# Patient Record
Sex: Female | Born: 1960 | Race: White | Hispanic: No | Marital: Married | Smoking: Former smoker
Health system: Southern US, Community
[De-identification: ages and names within clinical notes are randomized; demographics above are authoritative.]

## PROBLEM LIST (undated history)

## (undated) DIAGNOSIS — M5430 Sciatica, unspecified side: Secondary | ICD-10-CM

## (undated) DIAGNOSIS — M1990 Unspecified osteoarthritis, unspecified site: Secondary | ICD-10-CM

## (undated) DIAGNOSIS — J45909 Unspecified asthma, uncomplicated: Secondary | ICD-10-CM

## (undated) DIAGNOSIS — F32A Depression, unspecified: Secondary | ICD-10-CM

## (undated) DIAGNOSIS — F419 Anxiety disorder, unspecified: Secondary | ICD-10-CM

## (undated) DIAGNOSIS — J449 Chronic obstructive pulmonary disease, unspecified: Secondary | ICD-10-CM

## (undated) HISTORY — PX: APPENDECTOMY: SHX54

## (undated) HISTORY — DX: Depression, unspecified: F32.A

## (undated) HISTORY — DX: Anxiety disorder, unspecified: F41.9

## (undated) HISTORY — PX: TONSILLECTOMY: SUR1361

## (undated) HISTORY — DX: Unspecified osteoarthritis, unspecified site: M19.90

## (undated) HISTORY — PX: CHOLECYSTECTOMY: SHX55

## (undated) HISTORY — DX: Chronic obstructive pulmonary disease, unspecified: J44.9

---

## 1998-06-20 ENCOUNTER — Emergency Department (HOSPITAL_COMMUNITY): Admission: EM | Admit: 1998-06-20 | Discharge: 1998-06-20 | Payer: Self-pay

## 1998-06-24 ENCOUNTER — Emergency Department (HOSPITAL_COMMUNITY): Admission: EM | Admit: 1998-06-24 | Discharge: 1998-06-24 | Payer: Self-pay | Admitting: Emergency Medicine

## 1998-08-07 ENCOUNTER — Emergency Department (HOSPITAL_COMMUNITY): Admission: EM | Admit: 1998-08-07 | Discharge: 1998-08-08 | Payer: Self-pay | Admitting: Emergency Medicine

## 1999-02-23 ENCOUNTER — Inpatient Hospital Stay (HOSPITAL_COMMUNITY): Admission: EM | Admit: 1999-02-23 | Discharge: 1999-02-25 | Payer: Self-pay

## 1999-02-24 ENCOUNTER — Encounter: Payer: Self-pay | Admitting: Pediatrics

## 1999-10-22 ENCOUNTER — Emergency Department (HOSPITAL_COMMUNITY): Admission: EM | Admit: 1999-10-22 | Discharge: 1999-10-22 | Payer: Self-pay | Admitting: Emergency Medicine

## 2010-05-18 ENCOUNTER — Emergency Department (HOSPITAL_COMMUNITY): Admission: EM | Admit: 2010-05-18 | Discharge: 2010-05-18 | Payer: Self-pay | Admitting: Emergency Medicine

## 2010-05-18 IMAGING — CR DG FINGER RING 2+V*L*
3 series · 3 of 3 positions shown · non-contrast
Comparison: None.

CLINICAL DATA: Injury to left fourth finger

LEFT RING FINGER 2+V

[x finger pa left]
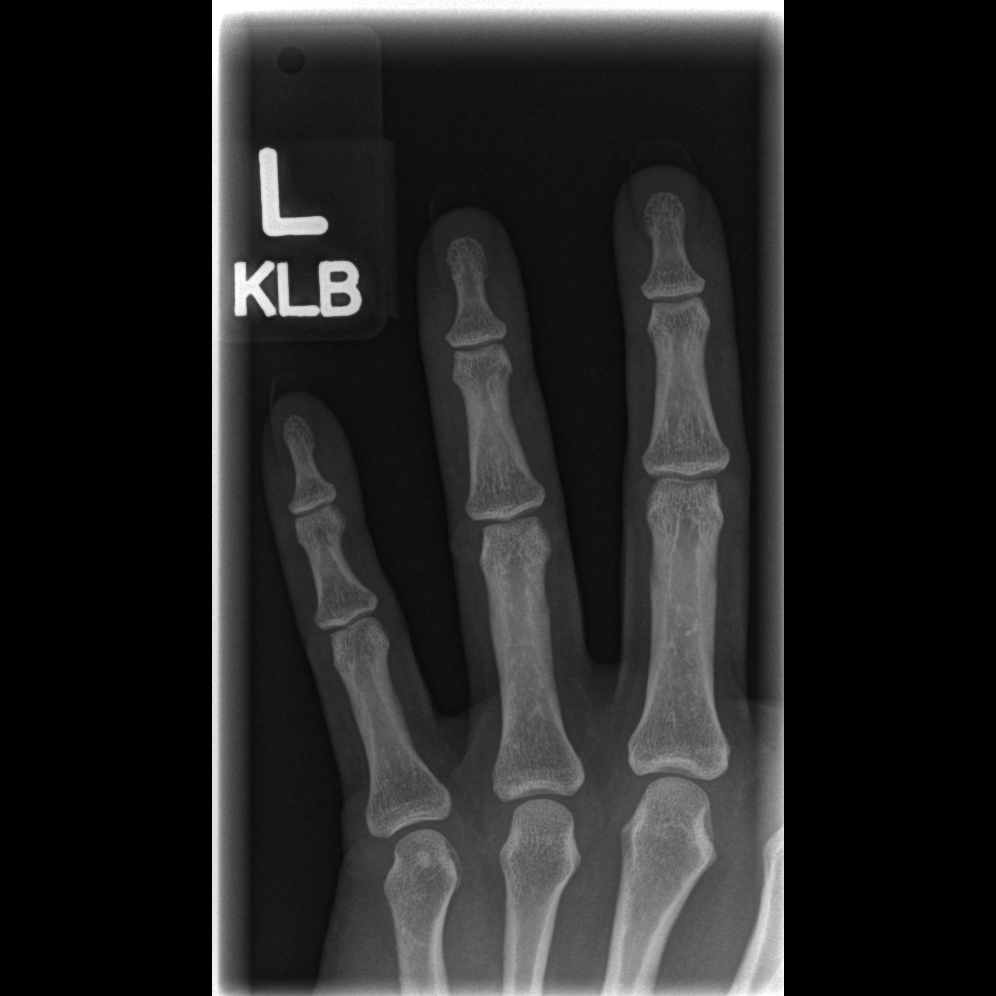

[x finger obl. left]
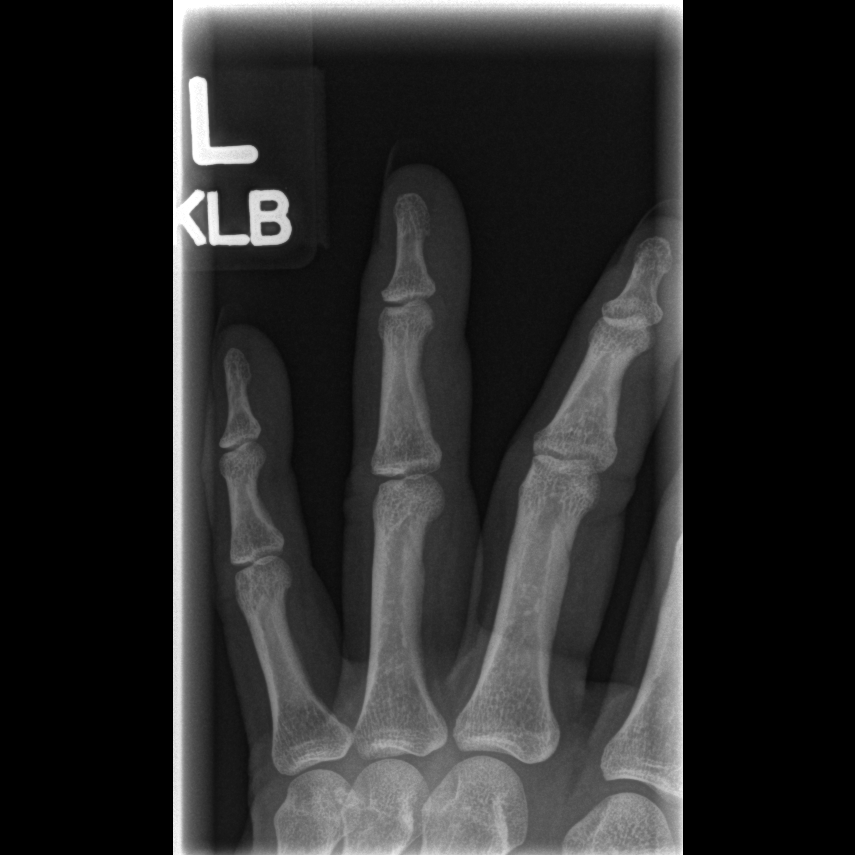

[x finger lateral left]
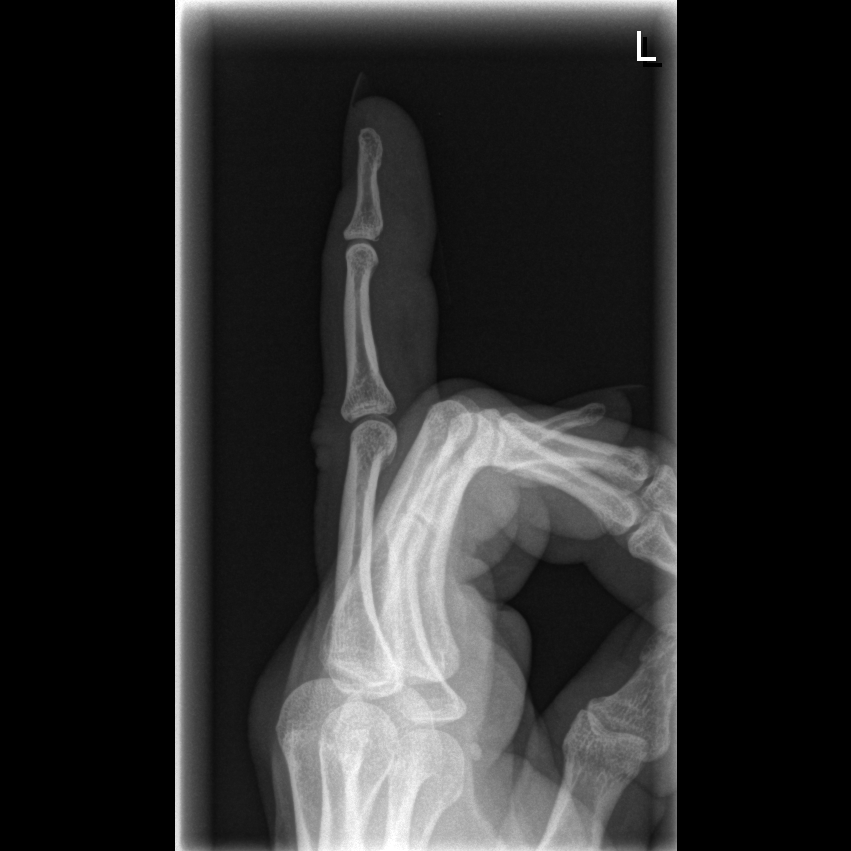

[3 of 3 positions shown; findings below may reference images not displayed]

FINDINGS: No fracture or subluxation.  .
IMPRESSION: Negative left fourth finger

## 2014-07-25 ENCOUNTER — Telehealth: Payer: Self-pay | Admitting: Endocrinology

## 2014-09-02 NOTE — Telephone Encounter (Signed)
Encounter Created in error

## 2020-05-19 ENCOUNTER — Encounter (HOSPITAL_COMMUNITY): Payer: Self-pay | Admitting: Emergency Medicine

## 2020-05-19 ENCOUNTER — Emergency Department (HOSPITAL_COMMUNITY): Payer: Self-pay

## 2020-05-19 ENCOUNTER — Emergency Department (HOSPITAL_COMMUNITY)
Admission: EM | Admit: 2020-05-19 | Discharge: 2020-05-19 | Disposition: A | Discharge status: Home / Self Care | Payer: Self-pay | Attending: Emergency Medicine | Admitting: Emergency Medicine

## 2020-05-19 ENCOUNTER — Other Ambulatory Visit: Payer: Self-pay

## 2020-05-19 DIAGNOSIS — M5431 Sciatica, right side: Secondary | ICD-10-CM | POA: Insufficient documentation

## 2020-05-19 DIAGNOSIS — F17210 Nicotine dependence, cigarettes, uncomplicated: Secondary | ICD-10-CM | POA: Insufficient documentation

## 2020-05-19 HISTORY — DX: Sciatica, unspecified side: M54.30

## 2020-05-19 IMAGING — DX DG LUMBAR SPINE COMPLETE 4+V
5 series · 5 of 5 positions shown · non-contrast
Comparison: None.

CLINICAL DATA: Pt here via EMS with pain to RIGHT hip going down
her leg. Hx of sciatica are

EXAM:
LUMBAR SPINE - COMPLETE 4+ VIEW; DG HIP (WITH OR WITHOUT PELVIS)
2-3V RIGHT

[l-spine ap]
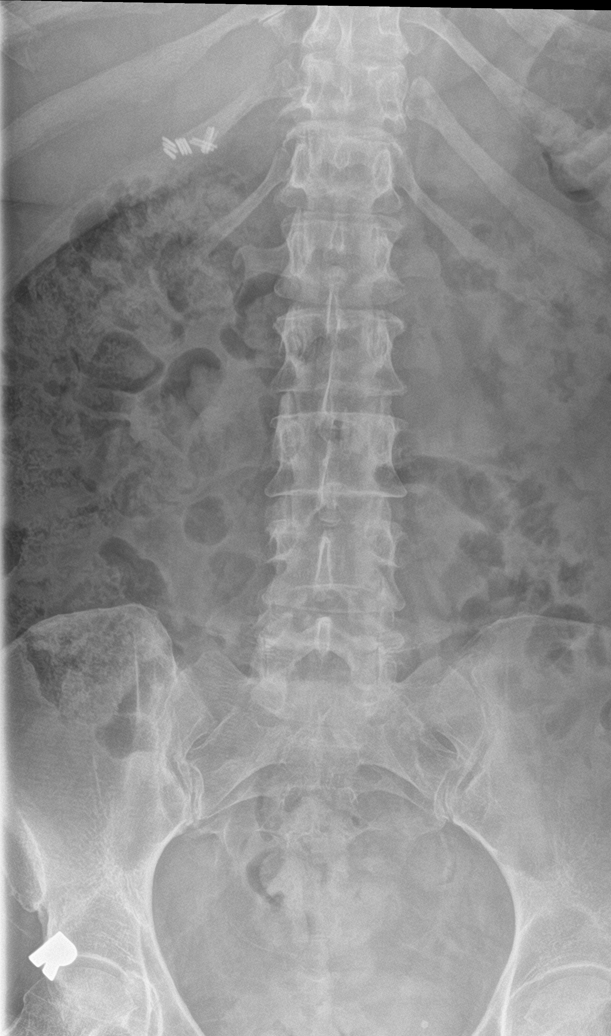

[l-spine obl (1 of 2)]
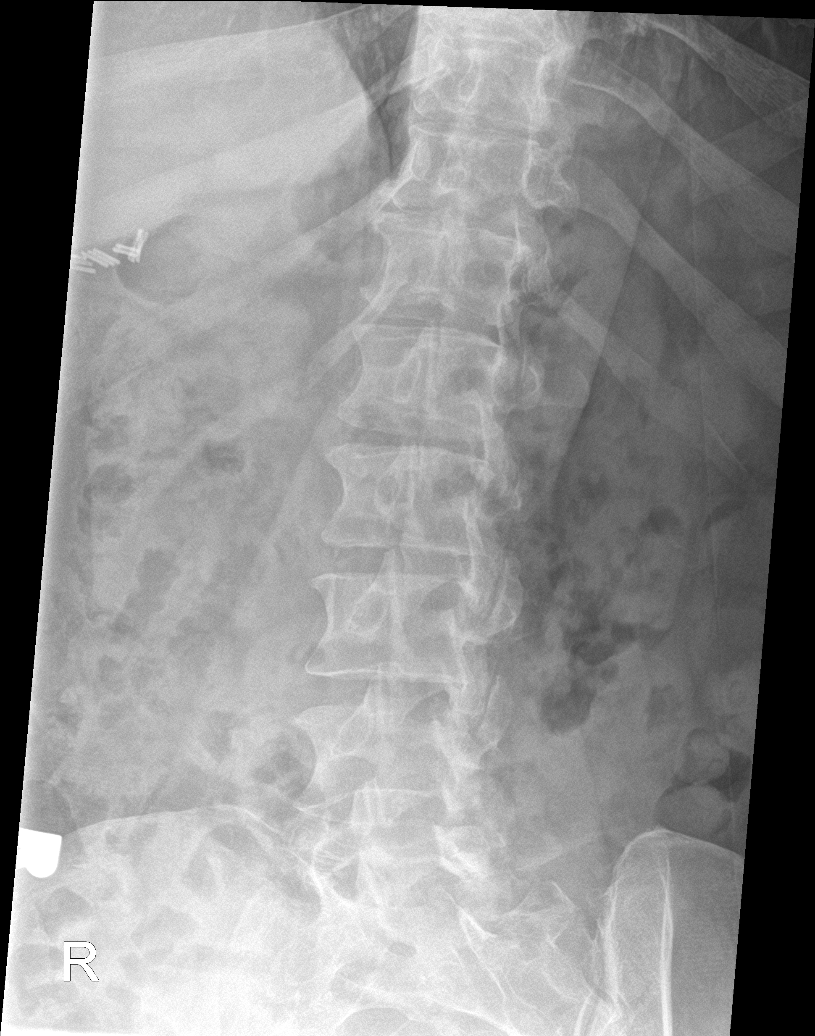

[l-spine obl (2 of 2)]
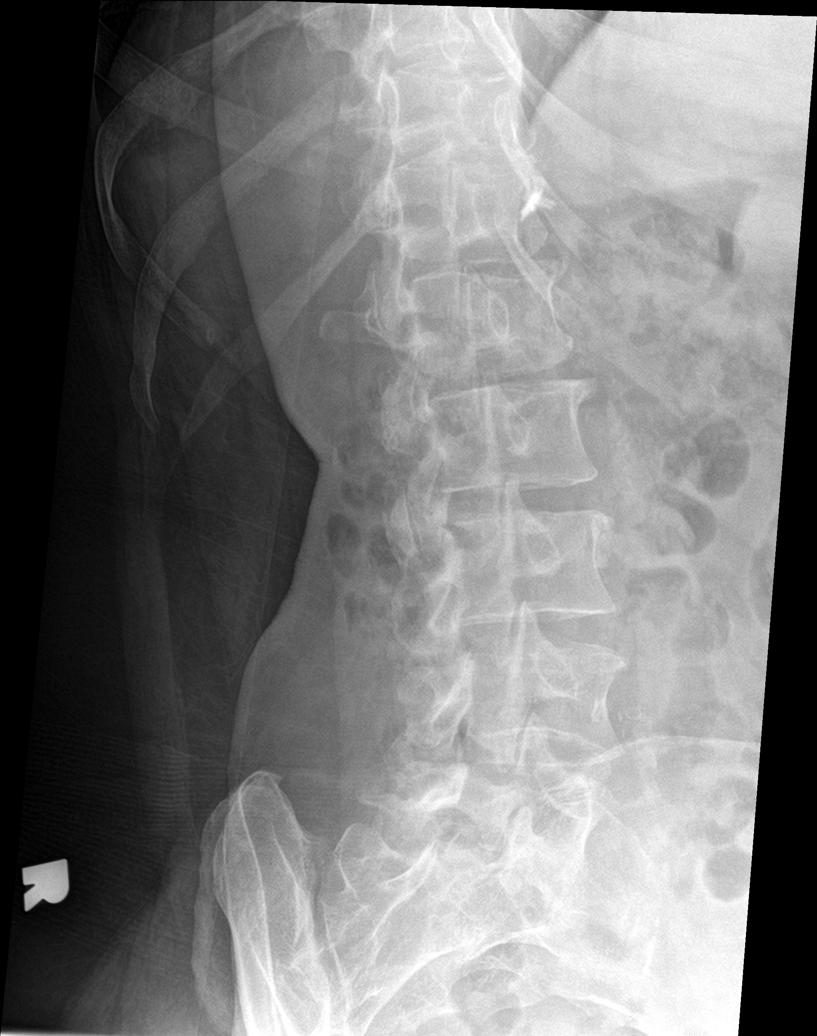

[l-spine lat]
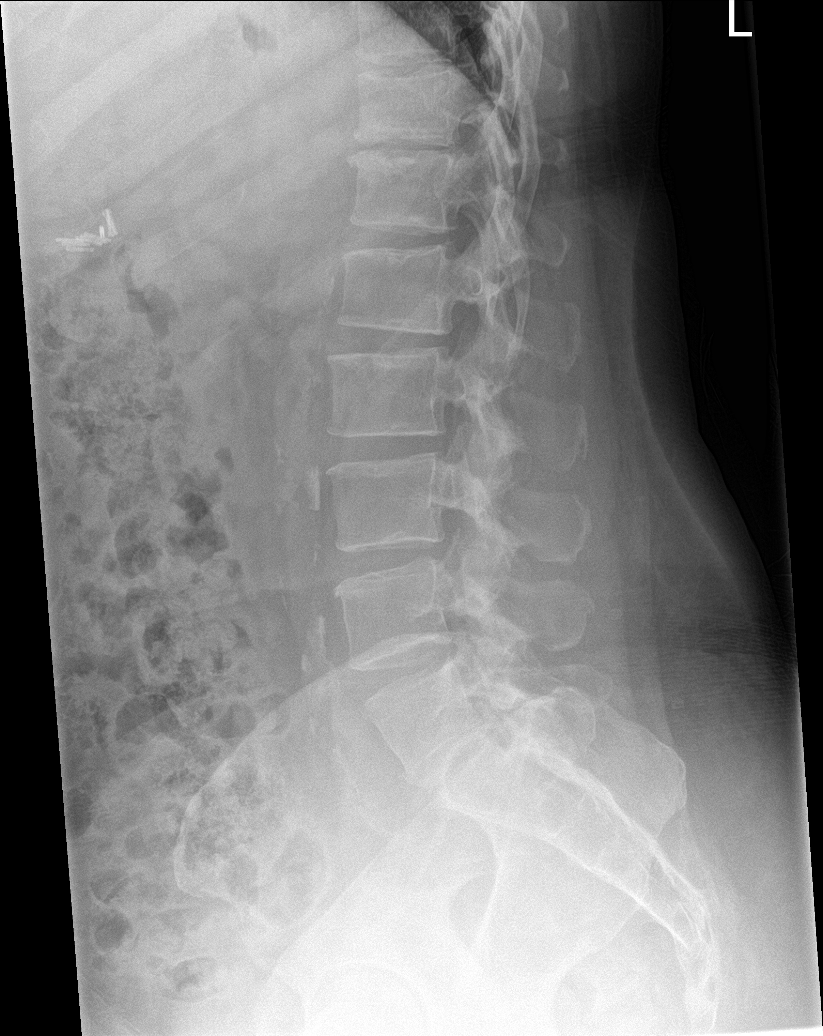

[l-spine spot]
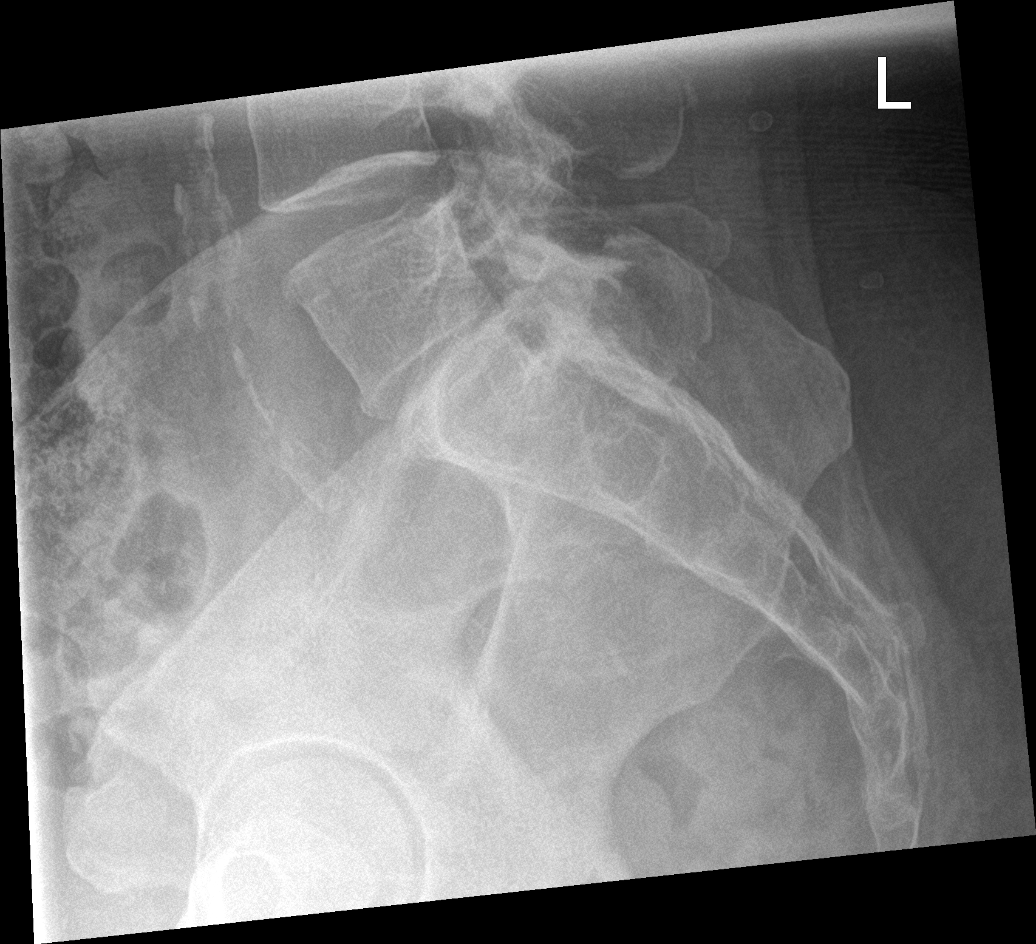

[5 of 5 positions shown; findings below may reference images not displayed]

FINDINGS: Lumbar spine: 5 non-rib-bearing lumbar type vertebral bodies. There
is minimal anterior wedging of the L1 vertebral body which could be
on a degenerative basis. The remaining vertebral body heights are
maintained. Intervertebral disc spaces are maintained. Mild
multilevel anterior spurring. Normal alignment. No focal bone
lesion. Surgical clips are noted in the right upper quadrant. SI
joints are symmetric. Aortic vascular calcification.

Right hip: No evidence of fracture or dislocation. Right hip joint
space is maintained. Pelvic ring is intact. Regional soft tissues
are unremarkable.
IMPRESSION: 1. Mild anterior wedging of the L1 vertebral body, age
indeterminate, could be on a degenerative basis. Recommend
correlation for focal pain and if there is clinical concern for
injury consider cross-sectional imaging.
2. No acute osseous abnormality in the right hip.

## 2020-05-19 IMAGING — DX DG HIP (WITH OR WITHOUT PELVIS) 2-3V*R*
3 series · 3 of 3 positions shown · non-contrast
Comparison: None.

CLINICAL DATA: Pt here via EMS with pain to RIGHT hip going down
her leg. Hx of sciatica are

EXAM:
LUMBAR SPINE - COMPLETE 4+ VIEW; DG HIP (WITH OR WITHOUT PELVIS)
2-3V RIGHT

[pelvis ap]
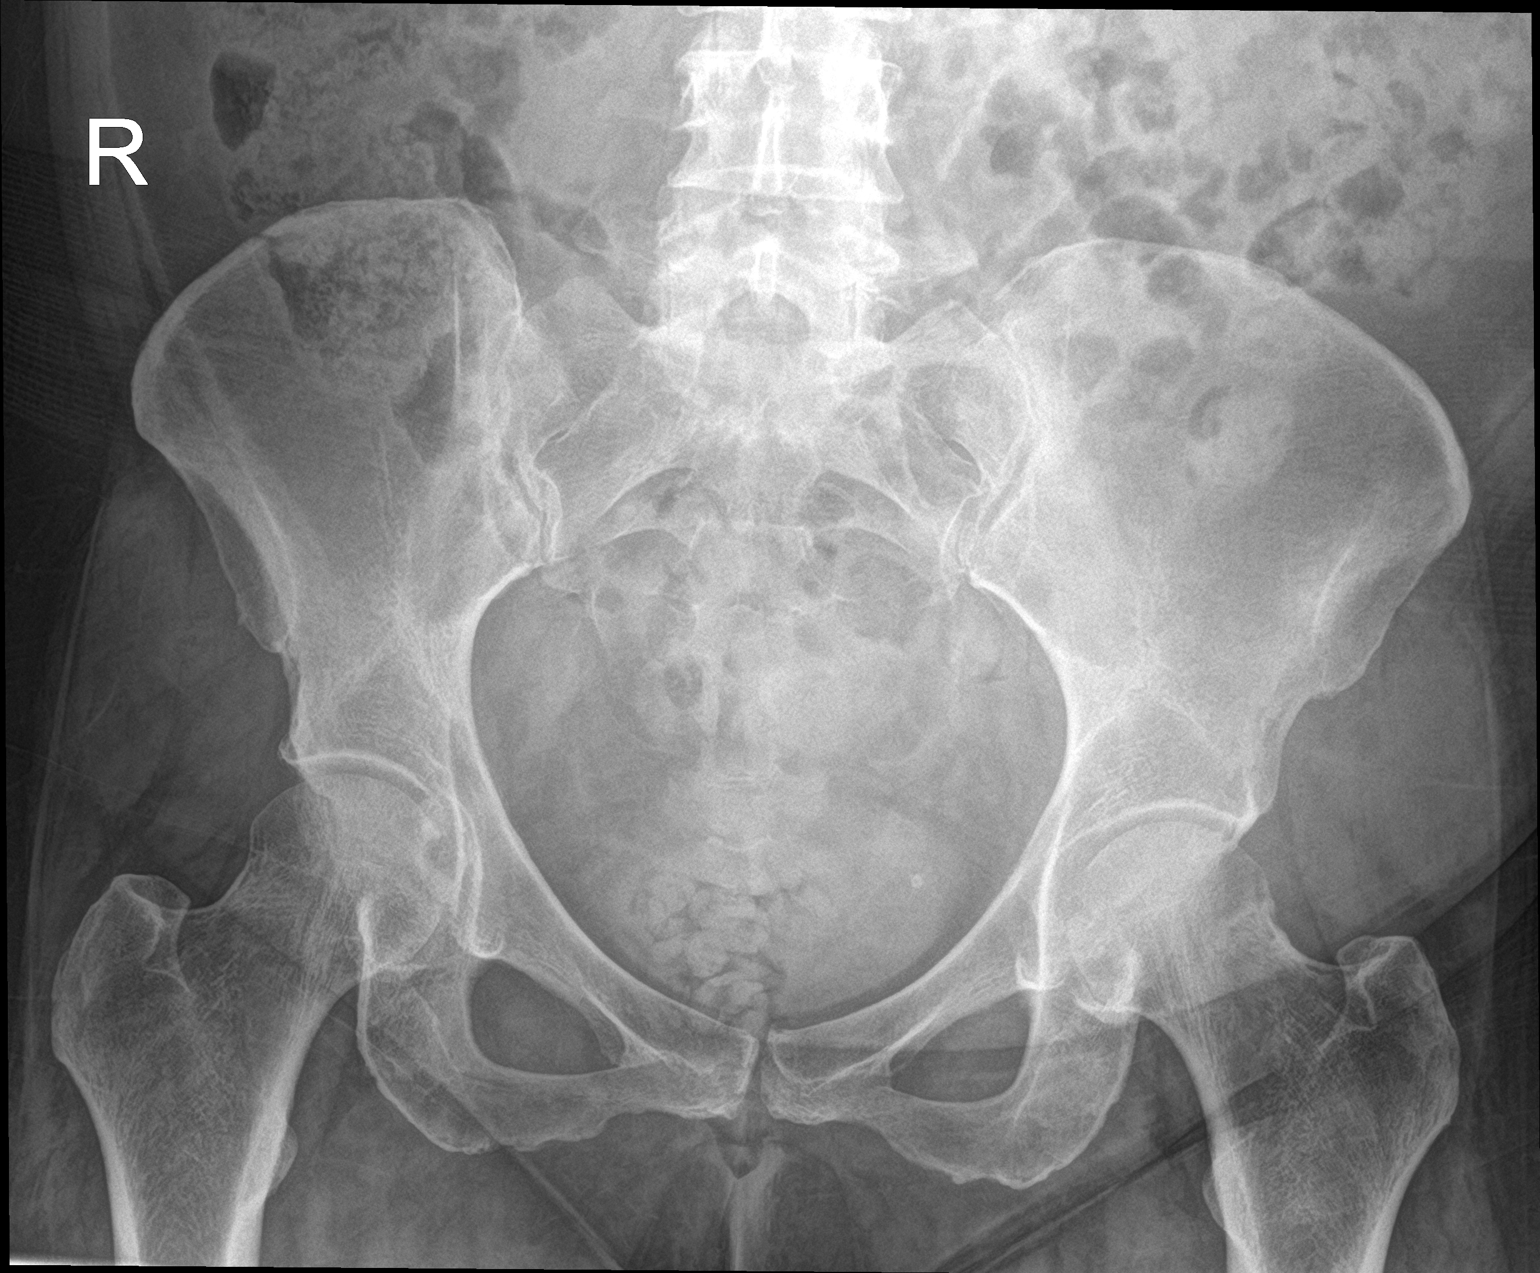

[hip ap]
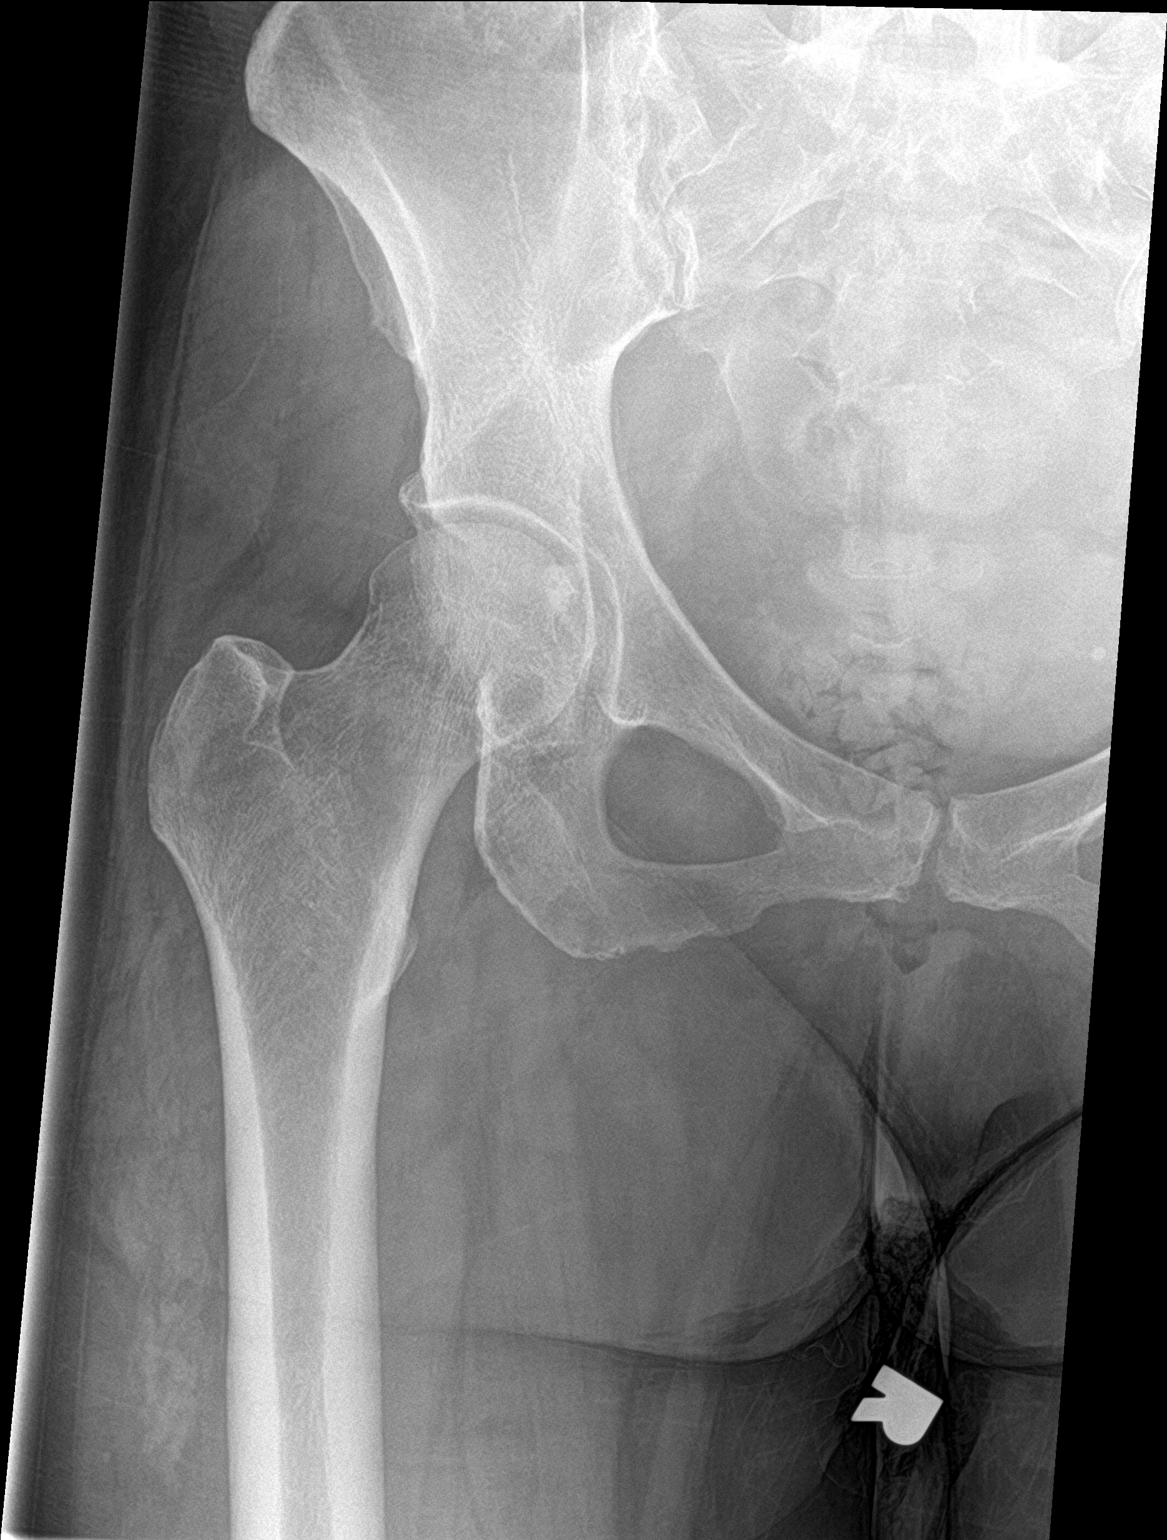

[hip frog leg]
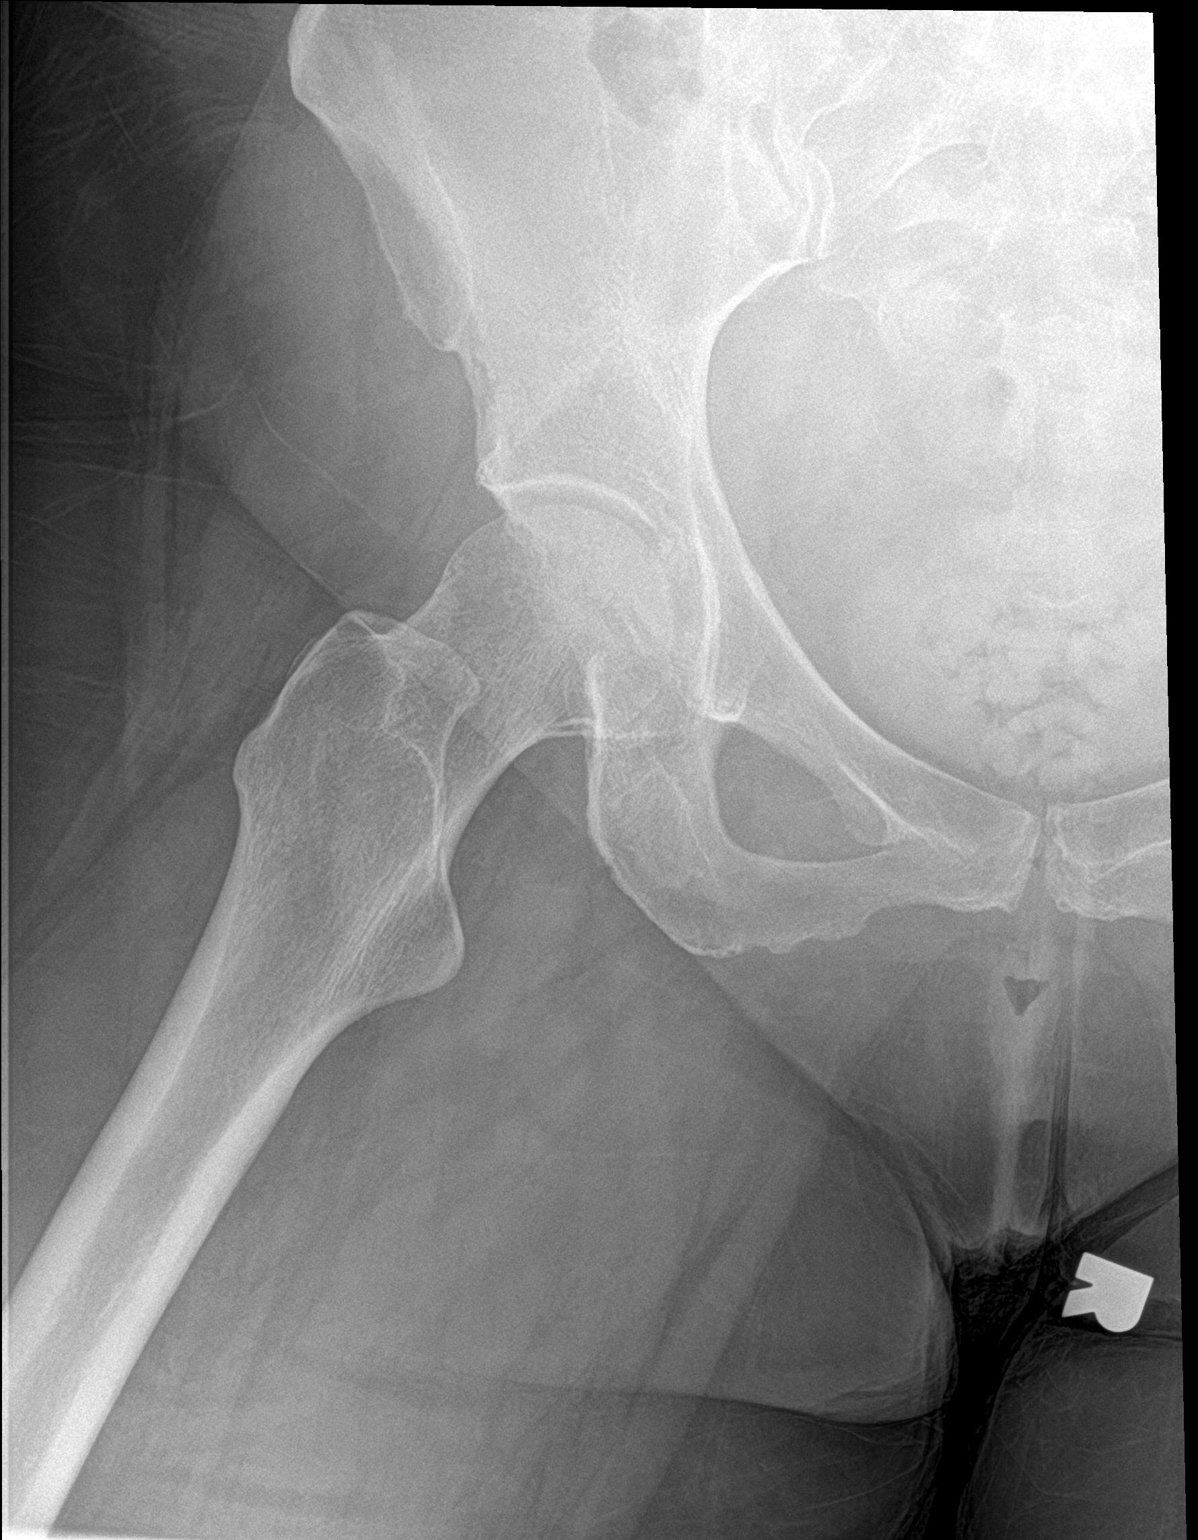

[3 of 3 positions shown; findings below may reference images not displayed]

FINDINGS: Lumbar spine: 5 non-rib-bearing lumbar type vertebral bodies. There
is minimal anterior wedging of the L1 vertebral body which could be
on a degenerative basis. The remaining vertebral body heights are
maintained. Intervertebral disc spaces are maintained. Mild
multilevel anterior spurring. Normal alignment. No focal bone
lesion. Surgical clips are noted in the right upper quadrant. SI
joints are symmetric. Aortic vascular calcification.

Right hip: No evidence of fracture or dislocation. Right hip joint
space is maintained. Pelvic ring is intact. Regional soft tissues
are unremarkable.
IMPRESSION: 1. Mild anterior wedging of the L1 vertebral body, age
indeterminate, could be on a degenerative basis. Recommend
correlation for focal pain and if there is clinical concern for
injury consider cross-sectional imaging.
2. No acute osseous abnormality in the right hip.

## 2020-05-19 MED ORDER — MELOXICAM 7.5 MG PO TABS
7.5000 mg | ORAL_TABLET | Freq: Every day | ORAL | 0 refills | Status: AC
Start: 2020-05-19 — End: 2020-05-29

## 2020-05-19 MED ORDER — METHOCARBAMOL 500 MG PO TABS
500.0000 mg | ORAL_TABLET | Freq: Once | ORAL | Status: AC
  Administered 2020-05-19: 500 mg via ORAL
  Filled 2020-05-19: qty 1

## 2020-05-19 MED ORDER — LIDOCAINE 5 % EX PTCH
1.0000 | MEDICATED_PATCH | CUTANEOUS | Status: DC
  Filled 2020-05-19: qty 1

## 2020-05-19 MED ORDER — HYDROCODONE-ACETAMINOPHEN 5-325 MG PO TABS
1.0000 | ORAL_TABLET | Freq: Once | ORAL | Status: AC
  Administered 2020-05-19: 1 via ORAL
  Filled 2020-05-19: qty 1

## 2020-05-19 MED ORDER — KETOROLAC TROMETHAMINE 60 MG/2ML IM SOLN
30.0000 mg | Freq: Once | INTRAMUSCULAR | Status: AC
  Administered 2020-05-19: 30 mg via INTRAMUSCULAR
  Filled 2020-05-19: qty 2

## 2020-05-19 MED ORDER — METHOCARBAMOL 500 MG PO TABS
500.0000 mg | ORAL_TABLET | Freq: Two times a day (BID) | ORAL | 0 refills | Status: DC
Start: 2020-05-19 — End: 2021-09-09

## 2020-05-19 NOTE — ED Provider Notes (Signed)
[facility] EMERGENCY DEPARTMENT Provider Note   CSN: [id number] Arrival date & time: 05/19/20  1923     History Chief Complaint  Patient presents with  . Sciatica    Sierra Deleon is a 59 y.o. female.  58yo female brought in by EMS from home with complaint of pain in her right buttock that radiates to lateral right thigh.  Pain to lateral right thigh is described as a numbness, reports her foot feels cold to her.  Patient states pain is off and on today, she went on to rest when she went to get up she was unable to stand up secondary to the pain.  Patient states she did have a fall a few days ago while making her bed and tripped on the frame, did not think she had significant injuries at that time.  Denies abdominal pain, groin numbness, loss of bowel or bladder control.  Patient has been taking naproxen at home without relief.        Past Medical History:  Diagnosis Date  . Sciatica     There are no problems to display for this patient.   Past Surgical History:  Procedure Laterality Date  . APPENDECTOMY    . CHOLECYSTECTOMY    . TONSILLECTOMY       OB History   No obstetric history on file.     History reviewed. No pertinent family history.  Social History   Tobacco Use  . Smoking status: Current Every Day Smoker  Substance Use Topics  . Alcohol use: Not Currently  . Drug use: Never    Home Medications Prior to Admission medications   Medication Sig Start Date End Date Taking? Authorizing Provider  meloxicam (MOBIC) 7.5 MG tablet Take 1 tablet (7.5 mg total) by mouth daily for 10 days. 05/19/20 05/29/20  Jeannie Fend, PA-C  methocarbamol (ROBAXIN) 500 MG tablet Take 1 tablet (500 mg total) by mouth 2 (two) times daily. 05/19/20   Jeannie Fend, PA-C    Allergies    Patient has no known allergies.  Review of Systems   Review of Systems  Constitutional: Negative for fever.  Gastrointestinal: Negative for abdominal pain, constipation and  diarrhea.  Genitourinary: Negative for difficulty urinating and dysuria.  Musculoskeletal: Positive for back pain and gait problem.  Skin: Negative for rash and wound.  Allergic/Immunologic: Negative for immunocompromised state.  Neurological: Positive for weakness and numbness.    Physical Exam Updated Vital Signs BP 110/70   Pulse 94   Temp (!) 97.1 F (36.2 C) (Temporal)   Resp 18   Ht 5\' 7"  (1.702 m)   Wt 90.7 kg   SpO2 99%   BMI 31.32 kg/m   Physical Exam Vitals and nursing note reviewed.  Constitutional:      General: She is not in acute distress.    Appearance: She is well-developed. She is not diaphoretic.  HENT:     Head: Normocephalic and atraumatic.  Cardiovascular:     Pulses: Normal pulses.  Pulmonary:     Effort: Pulmonary effort is normal.  Abdominal:     Palpations: Abdomen is soft.     Tenderness: There is no abdominal tenderness.  Musculoskeletal:        General: Tenderness present. No swelling or deformity.       Back:  Skin:    General: Skin is warm and dry.     Findings: No erythema or rash.  Neurological:     Mental Status: She is  alert and oriented to person, place, and time.     Sensory: Sensation is intact.     Motor: No weakness.     Deep Tendon Reflexes: Babinski sign absent on the right side. Babinski sign absent on the left side.     Reflex Scores:      Patellar reflexes are 1+ on the right side and 1+ on the left side.      Achilles reflexes are 1+ on the right side and 1+ on the left side. Psychiatric:        Behavior: Behavior normal.     ED Results / Procedures / Treatments   Labs (all labs ordered are listed, but only abnormal results are displayed) Labs Reviewed - No data to display  EKG None  Radiology DG Lumbar Spine Complete  Result Date: 05/19/2020 CLINICAL DATA:  Pt here via EMS with pain to RIGHT hip going down her leg. Hx of sciatica are EXAM: LUMBAR SPINE - COMPLETE 4+ VIEW; DG HIP (WITH OR WITHOUT PELVIS)  2-3V RIGHT COMPARISON:  None. FINDINGS: Lumbar spine: 5 non-rib-bearing lumbar type vertebral bodies. There is minimal anterior wedging of the L1 vertebral body which could be on a degenerative basis. The remaining vertebral body heights are maintained. Intervertebral disc spaces are maintained. Mild multilevel anterior spurring. Normal alignment. No focal bone lesion. Surgical clips are noted in the right upper quadrant. SI joints are symmetric. Aortic vascular calcification. Right hip: No evidence of fracture or dislocation. Right hip joint space is maintained. Pelvic ring is intact. Regional soft tissues are unremarkable. IMPRESSION: 1. Mild anterior wedging of the L1 vertebral body, age indeterminate, could be on a degenerative basis. Recommend correlation for focal pain and if there is clinical concern for injury consider cross-sectional imaging. 2. No acute osseous abnormality in the right hip. Electronically Signed   By: Audie Pinto M.D.   On: 05/19/2020 21:36   DG Hip Unilat With Pelvis 2-3 Views Right  Result Date: 05/19/2020 CLINICAL DATA:  Pt here via EMS with pain to RIGHT hip going down her leg. Hx of sciatica are EXAM: LUMBAR SPINE - COMPLETE 4+ VIEW; DG HIP (WITH OR WITHOUT PELVIS) 2-3V RIGHT COMPARISON:  None. FINDINGS: Lumbar spine: 5 non-rib-bearing lumbar type vertebral bodies. There is minimal anterior wedging of the L1 vertebral body which could be on a degenerative basis. The remaining vertebral body heights are maintained. Intervertebral disc spaces are maintained. Mild multilevel anterior spurring. Normal alignment. No focal bone lesion. Surgical clips are noted in the right upper quadrant. SI joints are symmetric. Aortic vascular calcification. Right hip: No evidence of fracture or dislocation. Right hip joint space is maintained. Pelvic ring is intact. Regional soft tissues are unremarkable. IMPRESSION: 1. Mild anterior wedging of the L1 vertebral body, age indeterminate, could be  on a degenerative basis. Recommend correlation for focal pain and if there is clinical concern for injury consider cross-sectional imaging. 2. No acute osseous abnormality in the right hip. Electronically Signed   By: Audie Pinto M.D.   On: 05/19/2020 21:36    Procedures Procedures (including critical care time)  Medications Ordered in ED Medications  lidocaine (LIDODERM) 5 % 1 patch (1 patch Transdermal Refused 05/19/20 2047)  methocarbamol (ROBAXIN) tablet 500 mg (500 mg Oral Given 05/19/20 2046)  HYDROcodone-acetaminophen (NORCO/VICODIN) 5-325 MG per tablet 1 tablet (1 tablet Oral Given 05/19/20 2046)  ketorolac (TORADOL) injection 30 mg (30 mg Intramuscular Given 05/19/20 2046)    ED Course  I have reviewed the  triage vital signs and the nursing notes.  Pertinent labs & imaging results that were available during my care of the patient were reviewed by me and considered in my medical decision making (see chart for details).  Clinical Course as of May 19 2148  Mon May 19, 2020  3545 59 year old female with complaint of pain in the right lower buttocks radiating to the right lateral thigh.  Patient did have a fall a few days ago, did not think she had any injuries at that time.  On exam patient has tenderness in her right SI joint, leg strength and reflexes are symmetric.  Patient was given Robaxin, Lidoderm, Norco, Toradol.  X-rays of the lumbar spine and pelvis completed.  Patient does have anterior wedging at L1, does not have any tenderness through the midline spine. Discussed results with patient, pain has improved with medications, states that she has a history of sciatica, previously treated with an injection and follow-up therapy with a chiropractor.  Patient recently relocated back to this area, does not have a PCP, will refer to [facility] for follow-up.  Will give prescriptions for meloxicam and Robaxin.  Recommend warm compresses and gentle stretching.   [LM]     Clinical Course User Index [LM] Alden Hipp   MDM Rules/Calculators/A&P                      Final Clinical Impression(s) / ED Diagnoses Final diagnoses:  Sciatica of right side    Rx / DC Orders ED Discharge Orders         Ordered    meloxicam (MOBIC) 7.5 MG tablet  Daily     05/19/20 2144    methocarbamol (ROBAXIN) 500 MG tablet  2 times daily     05/19/20 2144           Jeannie Fend, PA-C 05/19/20 2149    Maia Plan, MD 05/19/20 2318

## 2020-05-19 NOTE — Discharge Instructions (Addendum)
Take Robaxin and Meloxicam as needed as prescribed. Follow up with a primary care provider, contact [facility]. Recommend warm compresses followed by gentle stretching.

## 2020-05-19 NOTE — ED Triage Notes (Signed)
Pt here via EMS with pain to RIGHT hip going down her leg. Hx of sciatica.

## 2021-01-09 ENCOUNTER — Emergency Department (HOSPITAL_COMMUNITY)
Admission: EM | Admit: 2021-01-09 | Discharge: 2021-01-09 | Disposition: A | Discharge status: Home / Self Care | Payer: Self-pay | Attending: Emergency Medicine | Admitting: Emergency Medicine

## 2021-01-09 ENCOUNTER — Encounter (HOSPITAL_COMMUNITY): Payer: Self-pay | Admitting: Emergency Medicine

## 2021-01-09 ENCOUNTER — Emergency Department (HOSPITAL_COMMUNITY): Payer: Self-pay

## 2021-01-09 ENCOUNTER — Other Ambulatory Visit: Payer: Self-pay

## 2021-01-09 DIAGNOSIS — F17200 Nicotine dependence, unspecified, uncomplicated: Secondary | ICD-10-CM | POA: Insufficient documentation

## 2021-01-09 DIAGNOSIS — N23 Unspecified renal colic: Secondary | ICD-10-CM | POA: Insufficient documentation

## 2021-01-09 DIAGNOSIS — J45909 Unspecified asthma, uncomplicated: Secondary | ICD-10-CM | POA: Insufficient documentation

## 2021-01-09 HISTORY — DX: Unspecified asthma, uncomplicated: J45.909

## 2021-01-09 LAB — URINALYSIS, ROUTINE W REFLEX MICROSCOPIC
Bilirubin Urine: NEGATIVE
Glucose, UA: NEGATIVE mg/dL
Ketones, ur: NEGATIVE mg/dL
Leukocytes,Ua: NEGATIVE
Nitrite: NEGATIVE
Protein, ur: NEGATIVE mg/dL
RBC / HPF: >50 RBC/hpf — ABNORMAL HIGH (ref 0–5)
Specific Gravity, Urine: 1.009 (ref 1.005–1.030)
pH: 5 (ref 5.0–8.0)

## 2021-01-09 LAB — CBC WITH DIFFERENTIAL/PLATELET
Abs Immature Granulocytes: 0.08 10*3/uL — ABNORMAL HIGH (ref 0.00–0.07)
Basophils Absolute: 0.1 10*3/uL (ref 0.0–0.1)
Basophils Relative: 1 %
Eosinophils Absolute: 0.1 10*3/uL (ref 0.0–0.5)
Eosinophils Relative: 0 %
HCT: 44.4 % (ref 36.0–46.0)
Hemoglobin: 14.7 g/dL (ref 12.0–15.0)
Immature Granulocytes: 0 %
Lymphocytes Relative: 12 %
Lymphs Abs: 2.4 10*3/uL (ref 0.7–4.0)
MCH: 29.5 pg (ref 26.0–34.0)
MCHC: 33.1 g/dL (ref 30.0–36.0)
MCV: 89.2 fL (ref 80.0–100.0)
Monocytes Absolute: 0.8 10*3/uL (ref 0.1–1.0)
Monocytes Relative: 4 %
Neutro Abs: 16 10*3/uL — ABNORMAL HIGH (ref 1.7–7.7)
Neutrophils Relative %: 83 %
Platelets: 367 10*3/uL (ref 150–400)
RBC: 4.98 MIL/uL (ref 3.87–5.11)
RDW: 13.4 % (ref 11.5–15.5)
WBC: 19.4 10*3/uL — ABNORMAL HIGH (ref 4.0–10.5)
nRBC: 0 % (ref 0.0–0.2)

## 2021-01-09 LAB — BASIC METABOLIC PANEL
Anion gap: 12 (ref 5–15)
BUN: 20 mg/dL (ref 6–20)
CO2: 19 mmol/L — ABNORMAL LOW (ref 22–32)
Calcium: 9.1 mg/dL (ref 8.9–10.3)
Chloride: 105 mmol/L (ref 98–111)
Creatinine, Ser: 1.04 mg/dL — ABNORMAL HIGH (ref 0.44–1.00)
GFR, Estimated: >60 mL/min (ref >60)
Glucose, Bld: 153 mg/dL — ABNORMAL HIGH (ref 70–99)
Potassium: 3.6 mmol/L (ref 3.5–5.1)
Sodium: 136 mmol/L (ref 135–145)

## 2021-01-09 LAB — POC URINE PREG, ED: Preg Test, Ur: NEGATIVE

## 2021-01-09 IMAGING — CT CT RENAL STONE PROTOCOL
2 of 4 series · 16 of 46 positions shown, 18 images · non-contrast
Comparison: None.

CLINICAL DATA: Flank pain. Abdominal pain with nausea and vomiting.

EXAM:
CT ABDOMEN AND PELVIS WITHOUT CONTRAST
TECHNIQUE: Multidetector CT imaging of the abdomen and pelvis was performed
following the standard protocol without IV contrast.

[Series 2: axial st · axial · 0.95mm/px · z∈[+746,+1171]mm · 13 of 99 slices shown, 15 images]
[im 7/99  soft-tissue]
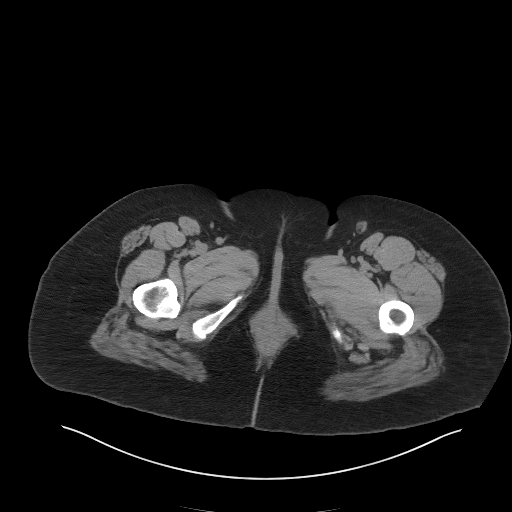
[im 7/99  bone]
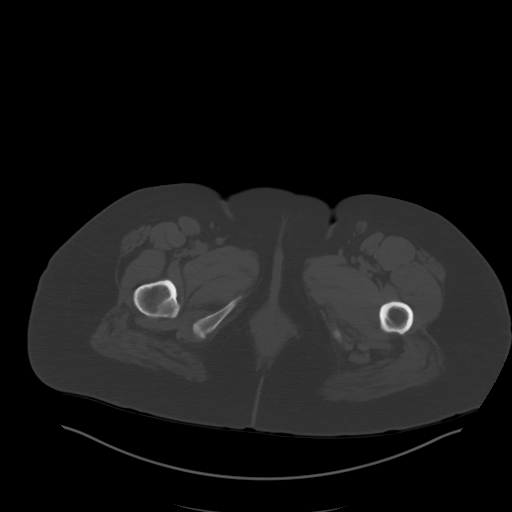
[im 13/99  soft-tissue]
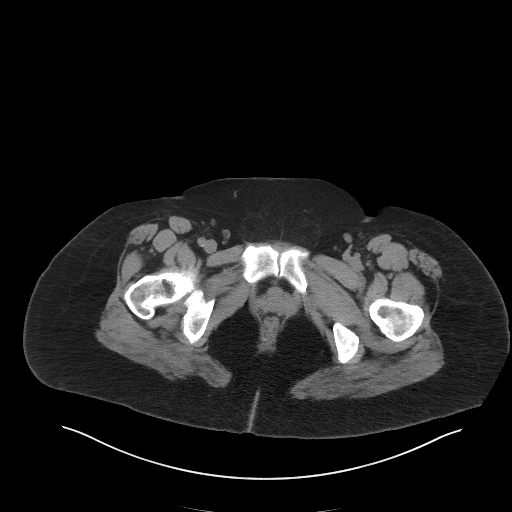
[im 19/99  soft-tissue]
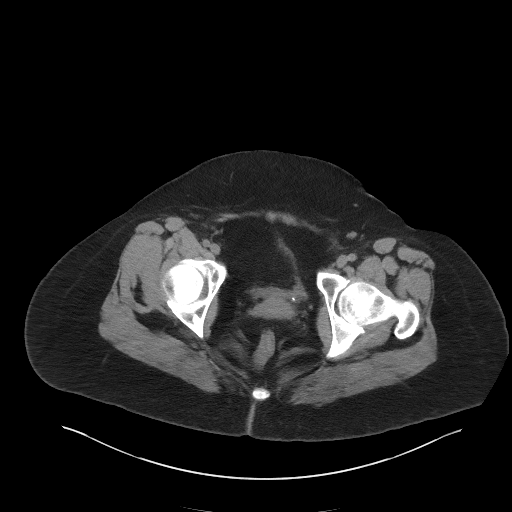
[im 31/99  soft-tissue]
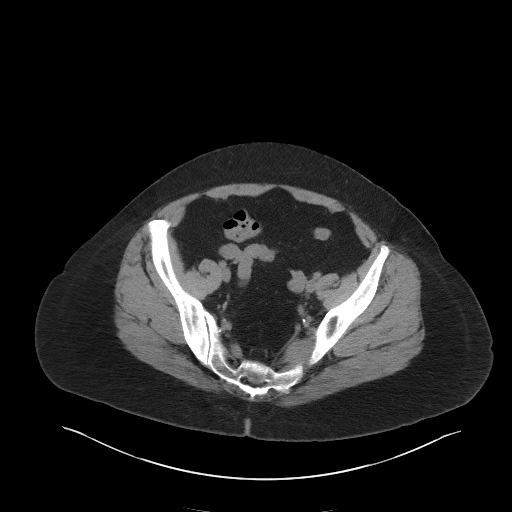
[im 37/99  soft-tissue]
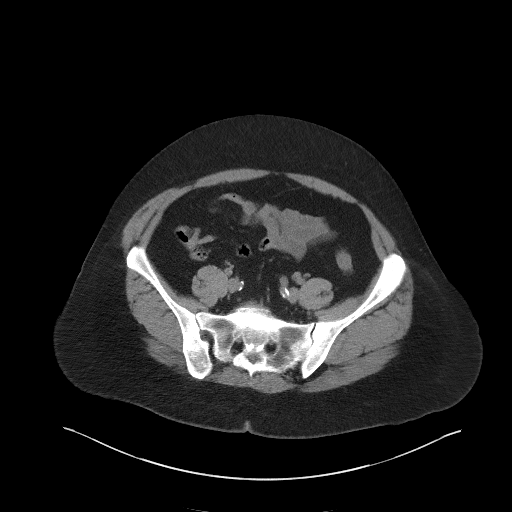
[im 43/99  soft-tissue]
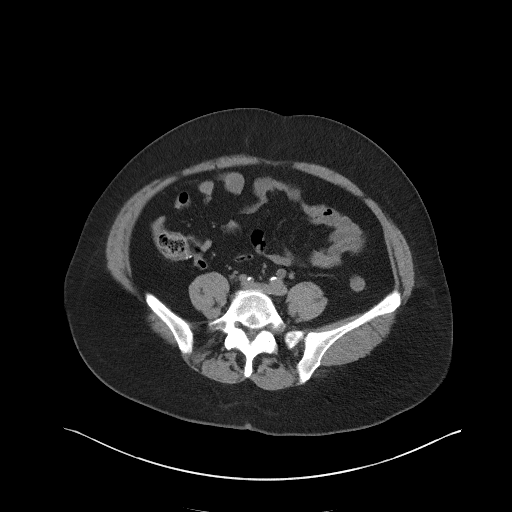
[im 50/99  soft-tissue]
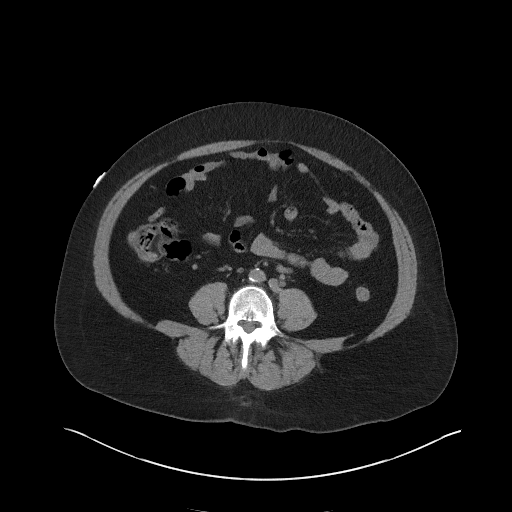
[im 56/99  soft-tissue]
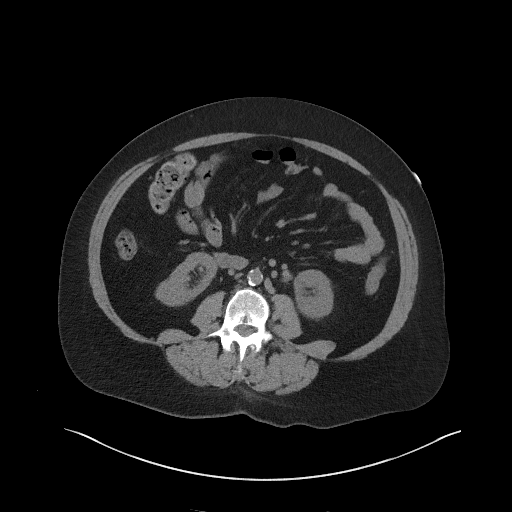
[im 62/99  soft-tissue]
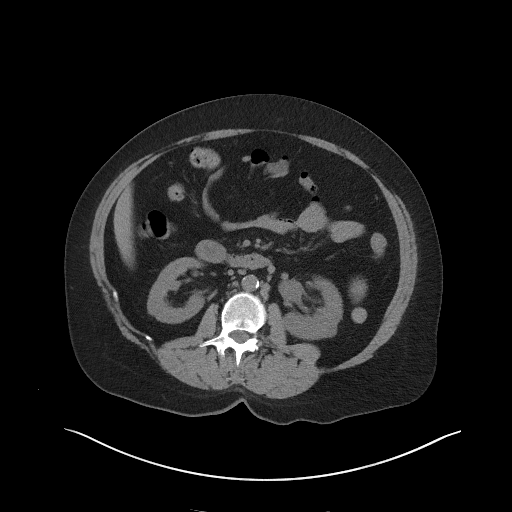
[im 62/99  bone]
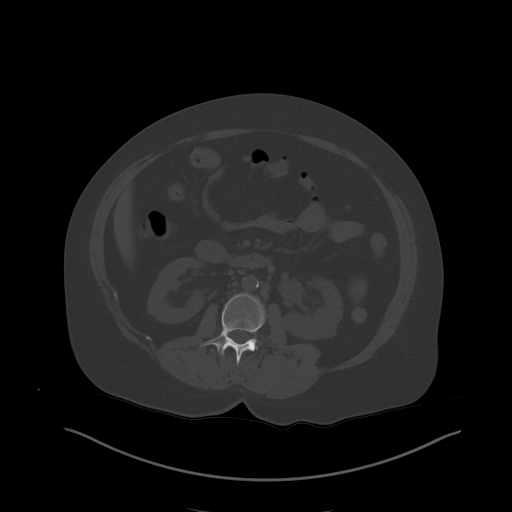
[im 68/99  soft-tissue]
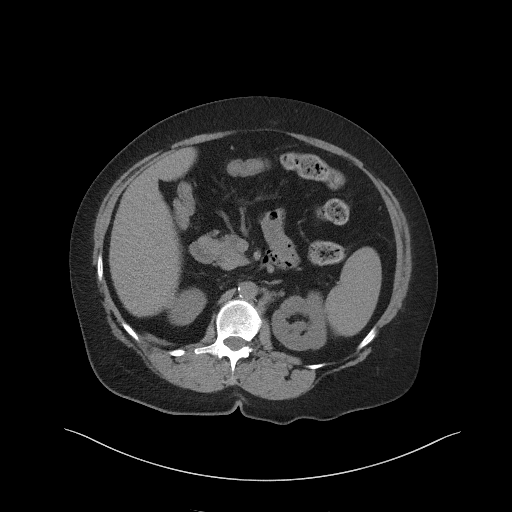
[im 80/99  soft-tissue]
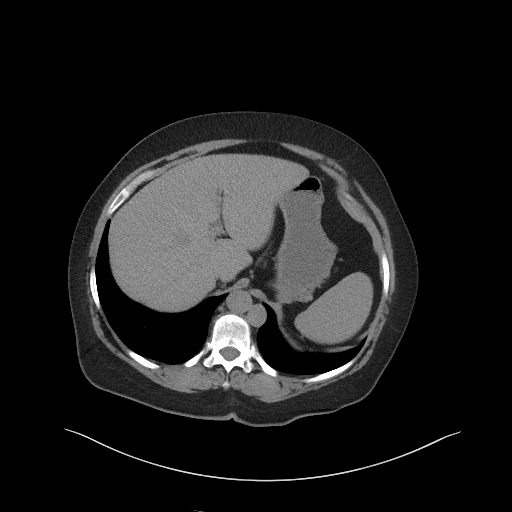
[im 86/99  soft-tissue]
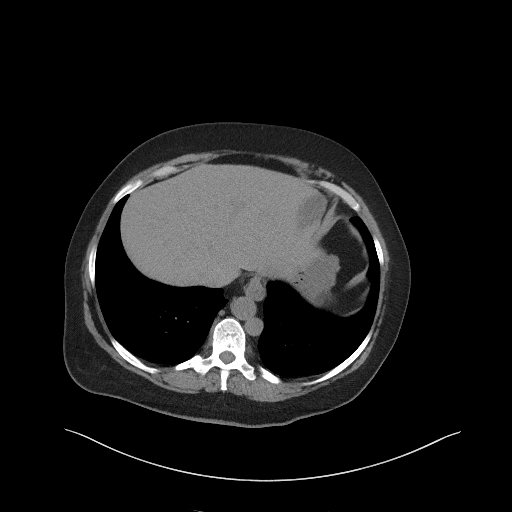
[im 92/99  soft-tissue]
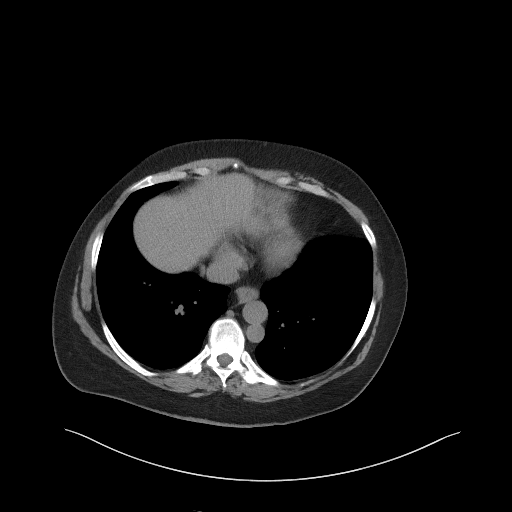

[Series 5: coronal st · coronal · 0.83mm/px · 3 of 114 slices shown]
[im 38/114  soft-tissue]
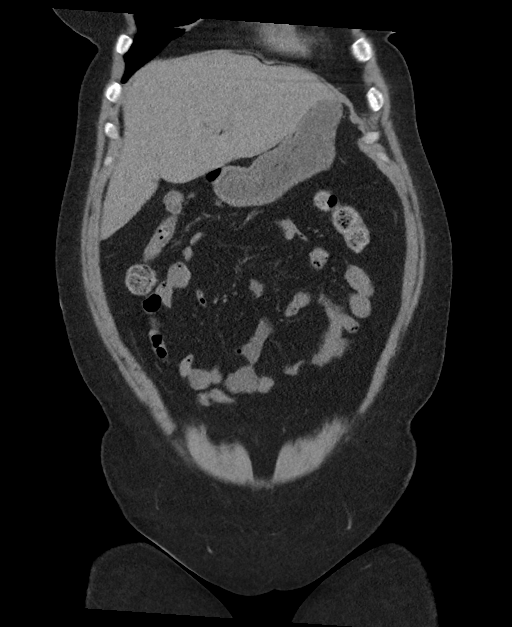
[im 51/114  soft-tissue]
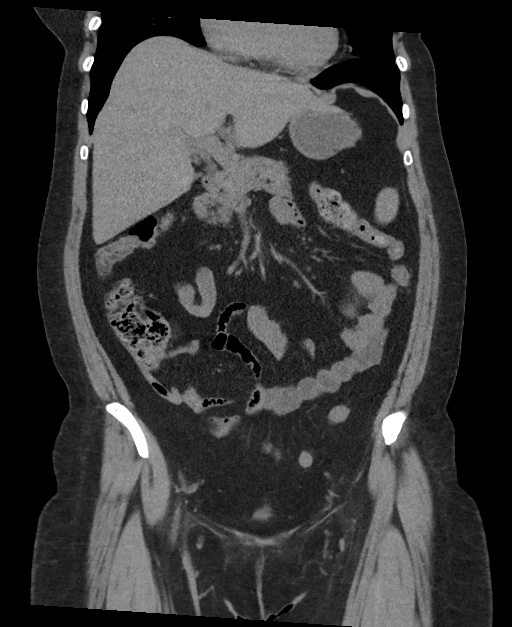
[im 63/114  soft-tissue]
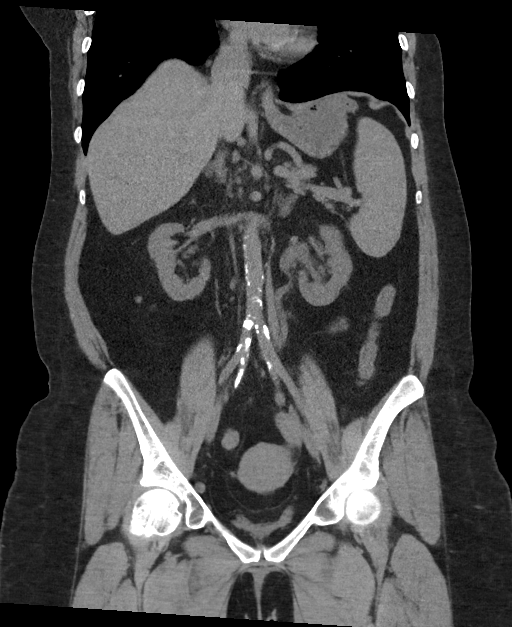

[16 of 46 positions shown; findings below may reference images not displayed]

FINDINGS: Lower chest: No acute abnormality.

Hepatobiliary: No focal liver abnormality is seen. Status post
cholecystectomy. No biliary dilatation.

Pancreas: Unremarkable. No pancreatic ductal dilatation or
surrounding inflammatory changes.

Spleen: Normal in size without focal abnormality.

Adrenals/Urinary Tract: Adrenal glands are unremarkable. Left sided
hydronephrosis and hydroureter is identified with 3 by 2 mm left UVJ
calculus, image 81/2. no right renal calculi or hydronephrosis.
Exophytic cyst arising off the lateral cortex of the left kidney
measures 1.6 cm and 18 Hounsfield units. This is technically
incompletely characterized without IV contrast.

Stomach/Bowel: Stomach appears normal. Signs of previous
appendectomy. No bowel wall thickening, inflammation, or distension.

Vascular/Lymphatic: Aortic atherosclerosis. No aneurysm. No
abdominopelvic adenopathy.

Reproductive: Uterus and bilateral adnexa are unremarkable.

Other: No free fluid or fluid collections.

Musculoskeletal: No acute or significant osseous findings.
Degenerative disc disease is identified. Most advanced at L5-S1.
IMPRESSION: 1. Left-sided hydronephrosis and hydroureter secondary to 3 by 2 mm
left UVJ calculus.
2. 1.6 cm exophytic cyst off the lateral cortex of the left kidney
is incompletely characterized without IV contrast.

Aortic Atherosclerosis ([J6]-[J6]).

## 2021-01-09 MED ORDER — OXYCODONE HCL 5 MG PO TABS: 2.5000 mg | ORAL_TABLET | Freq: Four times a day (QID) | ORAL | 0 refills | Status: DC | PRN

## 2021-01-09 MED ORDER — ONDANSETRON HCL 4 MG/2ML IJ SOLN
4.0000 mg | Freq: Once | INTRAMUSCULAR | Status: AC
  Administered 2021-01-09: 4 mg via INTRAVENOUS
  Filled 2021-01-09: qty 2

## 2021-01-09 MED ORDER — HYDROMORPHONE HCL 1 MG/ML IJ SOLN
1.0000 mg | Freq: Once | INTRAMUSCULAR | Status: AC
  Administered 2021-01-09: 1 mg via INTRAVENOUS
  Filled 2021-01-09: qty 1

## 2021-01-09 MED ORDER — HYDROMORPHONE HCL 1 MG/ML IJ SOLN
0.5000 mg | INTRAMUSCULAR | Status: DC | PRN
  Administered 2021-01-09: 0.5 mg via INTRAVENOUS
  Filled 2021-01-09: qty 1

## 2021-01-09 MED ORDER — KETOROLAC TROMETHAMINE 30 MG/ML IJ SOLN
30.0000 mg | Freq: Once | INTRAMUSCULAR | Status: AC
  Administered 2021-01-09: 30 mg via INTRAVENOUS
  Filled 2021-01-09: qty 1

## 2021-01-09 MED ORDER — PROMETHAZINE HCL 25 MG/ML IJ SOLN
12.5000 mg | Freq: Once | INTRAMUSCULAR | Status: AC
  Administered 2021-01-09: 12.5 mg via INTRAVENOUS
  Filled 2021-01-09: qty 1

## 2021-01-09 MED ORDER — TAMSULOSIN HCL 0.4 MG PO CAPS
0.4000 mg | ORAL_CAPSULE | Freq: Two times a day (BID) | ORAL | 0 refills | Status: DC
Start: 2021-01-09 — End: 2021-09-09

## 2021-01-09 MED ORDER — ONDANSETRON 4 MG PO TBDP
4.0000 mg | ORAL_TABLET | Freq: Three times a day (TID) | ORAL | 0 refills | Status: DC | PRN
Start: 2021-01-09 — End: 2021-09-09

## 2021-01-09 MED ORDER — KETOROLAC TROMETHAMINE 10 MG PO TABS
10.0000 mg | ORAL_TABLET | Freq: Four times a day (QID) | ORAL | 0 refills | Status: DC | PRN
Start: 2021-01-09 — End: 2021-09-09

## 2021-01-09 NOTE — Discharge Instructions (Signed)
Return to the ED immediately if you develop fever, uncontrolled pain or vomiting, or other concerns.   Please follow up with your primary care doctor about your renal cyst for further evaluation.

## 2021-01-09 NOTE — ED Triage Notes (Signed)
Pt to the ED [facility] with Left Flank that began this morning.  Pt states the pain is making her nauseous and she has been dry heaving.

## 2021-01-09 NOTE — ED Provider Notes (Signed)
[facility] EMERGENCY DEPARTMENT Provider Note   CSN: [id number] Arrival date & time: 01/09/21  1224     History Chief Complaint  Patient presents with  . Flank Pain    Sierra Deleon is a 60 y.o. female  60 year old female with onset of left-sided flank pain around 8:30 AM this morning. The pain is severe, colicky, sharp, nothing seems to make it any worse or any better. She has associated nausea and vomiting. She is never had anything like this before. She denies fever, chills, urinary symptoms.   Flank Pain This is a new problem. The current episode started 6 to 12 hours ago. The problem occurs constantly. The problem has not changed since onset.Nothing aggravates the symptoms. Nothing relieves the symptoms.       Past Medical History:  Diagnosis Date  . Asthma   . Sciatica     There are no problems to display for this patient.   Past Surgical History:  Procedure Laterality Date  . APPENDECTOMY    . CHOLECYSTECTOMY    . TONSILLECTOMY       OB History   No obstetric history on file.     History reviewed. No pertinent family history.  Social History   Tobacco Use  . Smoking status: Current Every Day Smoker    Packs/day: 0.50  . Smokeless tobacco: Never Used  Vaping Use  . Vaping Use: Never used  Substance Use Topics  . Alcohol use: Not Currently  . Drug use: Never    Home Medications Prior to Admission medications   Medication Sig Start Date End Date Taking? Authorizing Provider  methocarbamol (ROBAXIN) 500 MG tablet Take 1 tablet (500 mg total) by mouth 2 (two) times daily. 05/19/20   Jeannie Fend, PA-C    Allergies    Codeine  Review of Systems   Review of Systems  Genitourinary: Positive for flank pain.    Physical Exam Updated Vital Signs BP (!) 165/128 (BP Location: Right Arm) Comment: pt constantly rocking due to pain  Pulse (!) 54   Temp 97.6 F (36.4 C) (Oral)   Resp 20   Ht 5\' 4"  (1.626 m)   Wt 74.4 kg   SpO2 97%   BMI  28.15 kg/m   Physical Exam  ED Results / Procedures / Treatments   Labs (all labs ordered are listed, but only abnormal results are displayed) Labs Reviewed  CBC WITH DIFFERENTIAL/PLATELET - Abnormal; Notable for the following components:      Result Value   WBC 19.4 (*)    Neutro Abs 16.0 (*)    Abs Immature Granulocytes 0.08 (*)    All other components within normal limits  BASIC METABOLIC PANEL  URINALYSIS, ROUTINE W REFLEX MICROSCOPIC  POC URINE PREG, ED    EKG None  Radiology CT Renal Stone Study  Result Date: 01/09/2021 CLINICAL DATA:  Flank pain. Abdominal pain with nausea and vomiting. EXAM: CT ABDOMEN AND PELVIS WITHOUT CONTRAST TECHNIQUE: Multidetector CT imaging of the abdomen and pelvis was performed following the standard protocol without IV contrast. COMPARISON:  None. FINDINGS: Lower chest: No acute abnormality. Hepatobiliary: No focal liver abnormality is seen. Status post cholecystectomy. No biliary dilatation. Pancreas: Unremarkable. No pancreatic ductal dilatation or surrounding inflammatory changes. Spleen: Normal in size without focal abnormality. Adrenals/Urinary Tract: Adrenal glands are unremarkable. Left sided hydronephrosis and hydroureter is identified with 3 by 2 mm left UVJ calculus, image 81/2. no right renal calculi or hydronephrosis. Exophytic cyst arising off the lateral  cortex of the left kidney measures 1.6 cm and 18 Hounsfield units. This is technically incompletely characterized without IV contrast. Stomach/Bowel: Stomach appears normal. Signs of previous appendectomy. No bowel wall thickening, inflammation, or distension. Vascular/Lymphatic: Aortic atherosclerosis. No aneurysm. No abdominopelvic adenopathy. Reproductive: Uterus and bilateral adnexa are unremarkable. Other: No free fluid or fluid collections. Musculoskeletal: No acute or significant osseous findings. Degenerative disc disease is identified. Most advanced at L5-S1. IMPRESSION: 1.  Left-sided hydronephrosis and hydroureter secondary to 3 by 2 mm left UVJ calculus. 2. 1.6 cm exophytic cyst off the lateral cortex of the left kidney is incompletely characterized without IV contrast. Aortic Atherosclerosis (ICD10-I70.0). Electronically Signed   By: Signa Kell M.D.   On: 01/09/2021 14:54    Procedures Procedures (including critical care time)  Medications Ordered in ED Medications  ondansetron (ZOFRAN) injection 4 mg (has no administration in time range)  HYDROmorphone (DILAUDID) injection 1 mg (has no administration in time range)    ED Course  I have reviewed the triage vital signs and the nursing notes.  Pertinent labs & imaging results that were available during my care of the patient were reviewed by me and considered in my medical decision making (see chart for details).    MDM Rules/Calculators/A&P                          Given the large differential diagnosis for MELLODY MASRI, the decision making in this case is of high complexity.  I have reviewed the patient's labs which show  CBC with elevated white blood cell count likely acute phase reaction urinalysis negative for infection, BMP with slightly elevated creatinine, elevated blood glucose also likely acute phase reaction.  Negative pregnancy test I personally reviewed the patient's images which show exophytic cyst on the left kidney and a 2 to 3 mm UVJ stone with hydronephrosis. Patient appears to have renal colic secondary to proximal UVJ stone.   The presentation NOT consistent with an infected stone, nephric abscess, sepsis, or renal failure.  Similarly, this presentation is NOT consistent with AAA; Mesenteric Ischemia; Bowel Perforation; Bowel Obstruction; Sigmoid Volvulus; Diverticulitis; Appendicitis; Peritonitis; Cholecystitis, ascending cholangitis or other gallbladder disease; perforated ulcer; significant GI bleeding, splenic rupture/infarction; Hepatic abscess; or other surgical/acute  abdomen.  Similarly, this presentation is NOT consistent with ACS or Myocardial Ischemia; Pulmonary Embolism; fistula; incarcerated hernia; Pancreatitis, Aortic Dissection; Diabetic Ketoacidosis; Ischemic colitis; Psoas or other abscess; Methanol poisoning; Heavy metal toxicity; or porphyria.  Similarly, this case is NOT consistent with Fitz-Hugh-Curtis Syndrome, Ectopic Pregnancy, Placental Abruption, PID, Tubo-ovarian abscess, Ovarian Torsion, or STI.  Moreover, this presentation is NOT consistent with acute coronary syndrome, pulmonary embolism, dissection, borhaave's, arrythmia, pneumothorax, cardiac tamponade, or other emergent cardiopulmonary condition.  Lastly, this presentation is NOT consistent with pyelonephritis, urinary infection, pneumonia, or other focal bacterial infection.  Strict return and follow-up precautions have been given by me personally or by detailed written instruction verbalized by nursing staff using the teach back method. to the patient/family/caregiver(s).  Data Reviewed/Counseling: I have reviwed the patient's vital signs, nursing notes, and other relevant tests/information. I had a detailed discussion regarding the historical points, exam findings, and any diagnostic results supporting the discharge diagnosis. I also discussed the need for outpatient follow-up and the need to return to the ED if symptoms worsen or if there are any questions or concerns that arise at home.  Final Clinical Impression(s) / ED Diagnoses Final diagnoses:  Renal colic on left  side    Rx / DC Orders ED Discharge Orders    None       Arthor Captain, PA-C 01/09/21 2114    Rozelle Logan, DO 01/13/21 1345

## 2021-01-09 NOTE — ED Notes (Signed)
Awaiting urine sample. Pt got up to pee and states she forgot to get a urine sample. RN explained we still need a sample

## 2021-09-07 ENCOUNTER — Telehealth: Payer: Self-pay

## 2021-09-07 NOTE — Telephone Encounter (Signed)
Called client for follow up. She has upcoming appointment to establish primary medical care with [facility] on 09/09/21 at 10:30.  Client needing assistance with rent and utilities as well as possible other assistance. Referral Made to Ether Griffins SW with [facility] for follow up and possible assistance. Client notified that Mr. Yow would be reaching out to her. She states understanding.  Transportation arranged for upcoming medical appointment.   Sierra Nodal RN [employer]

## 2021-09-09 ENCOUNTER — Encounter: Payer: Self-pay | Admitting: Physician Assistant

## 2021-09-09 ENCOUNTER — Other Ambulatory Visit: Payer: Self-pay

## 2021-09-09 ENCOUNTER — Ambulatory Visit: Payer: Self-pay | Admitting: Physician Assistant

## 2021-09-09 VITALS — BP 111/73 | HR 100 | Temp 98.1°F | Wt 189.0 lb

## 2021-09-09 DIAGNOSIS — R7309 Other abnormal glucose: Secondary | ICD-10-CM

## 2021-09-09 DIAGNOSIS — Z1239 Encounter for other screening for malignant neoplasm of breast: Secondary | ICD-10-CM

## 2021-09-09 DIAGNOSIS — Z131 Encounter for screening for diabetes mellitus: Secondary | ICD-10-CM

## 2021-09-09 DIAGNOSIS — E669 Obesity, unspecified: Secondary | ICD-10-CM

## 2021-09-09 DIAGNOSIS — M545 Low back pain, unspecified: Secondary | ICD-10-CM

## 2021-09-09 DIAGNOSIS — Z7689 Persons encountering health services in other specified circumstances: Secondary | ICD-10-CM

## 2021-09-09 DIAGNOSIS — Z13220 Encounter for screening for lipoid disorders: Secondary | ICD-10-CM

## 2021-09-09 DIAGNOSIS — Z86010 Personal history of colonic polyps: Secondary | ICD-10-CM

## 2021-09-09 MED ORDER — PREDNISONE 10 MG PO TABS: ORAL_TABLET | ORAL | 0 refills | Status: DC

## 2021-09-09 NOTE — Progress Notes (Signed)
BP 111/73   Pulse 100   Temp 98.1 F (36.7 C)   Wt 189 lb (85.7 kg)   SpO2 96%   BMI 32.44 kg/m    Subjective:    Patient ID: Sierra Deleon, female    DOB: [birth year], 60 y.o.   MRN: [id number]  HPI: Sierra Deleon is a 60 y.o. female presenting on 09/09/2021 for New Patient (Initial Visit)   HPI   Pt had a negative covid 19 screening questionnaire.    Chief Complaint  Patient presents with   New Patient (Initial Visit)    Pt says she has had No PCP in 4 or 5 years.  [place]   She moved here to be closer to her father and her 2 adult children.    LMP approx age 82  She does not work.   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    She says she is having a lot of medical problems related to aches and pains.    She has not gotten covid vaccination.    She says Her aches and pains started in 2014.  She says she has degenerative spinal stenosis and says it has gotten worse since that time.   She saw a NS in [place] at [facility].  She  says they did xrays.  She isn't really sure anything else.  She thinks she had MRI as well.    Last mammogram- maybe 2010  She had colonoscopy 20 years ago- she had polpys.  It was done at that age due to IBS.  That was done in [place].   She doesn't remember the doctors name  Pt is not excellent historian.  In addition to rocking throughout entire appointment, she at times laid her head on the counter as if to nap.    Relevant past medical, surgical, family and social history reviewed and updated as indicated. Interim medical history since our last visit reviewed. Allergies and medications reviewed and updated.     Current Outpatient Medications:    Ibuprofen-diphenhydrAMINE Cit (MOTRIN PM PO), Take 2 tablets by mouth daily., Disp: , Rfl:    Melatonin 5 MG CAPS, Take 1 capsule by mouth at bedtime as needed (for sleep)., Disp: ,  Rfl:    naproxen (NAPROSYN) 250 MG tablet, Take by mouth 2 (two) times daily with a meal., Disp: , Rfl:      Review of Systems  Per HPI unless specifically indicated above     Objective:    BP 111/73   Pulse 100   Temp 98.1 F (36.7 C)   Wt 189 lb (85.7 kg)   SpO2 96%   BMI 32.44 kg/m   Wt Readings from Last 3 Encounters:  09/09/21 189 lb (85.7 kg)  01/09/21 164 lb (74.4 kg)  05/19/20 200 lb (90.7 kg)    Physical Exam Vitals reviewed.  Constitutional:      General: She is not in acute distress.    Appearance: She is well-developed. She is obese. She is not toxic-appearing.  HENT:     Head: Normocephalic and atraumatic.     Right Ear: Tympanic membrane, ear canal and external ear normal.     Left Ear: Tympanic membrane, ear canal and external ear normal.  Eyes:     Extraocular Movements: Extraocular movements intact.     Conjunctiva/sclera: Conjunctivae normal.  Pupils: Pupils are equal, round, and reactive to light.  Neck:     Thyroid: No thyromegaly.  Cardiovascular:     Rate and Rhythm: Normal rate and regular rhythm.  Pulmonary:     Effort: Pulmonary effort is normal.     Breath sounds: Normal breath sounds.  Abdominal:     General: Bowel sounds are normal.     Palpations: Abdomen is soft. There is no mass.     Tenderness: There is no abdominal tenderness.  Musculoskeletal:     Cervical back: Neck supple.     Thoracic back: Normal.     Lumbar back: Tenderness present.     Right lower leg: No edema.     Left lower leg: No edema.     Comments: Lumbar tenderness on left side soft tissues.  SLR testing was able to get to about 80 degrees before became uncomfortable  Lymphadenopathy:     Cervical: No cervical adenopathy.  Skin:    General: Skin is warm and dry.  Neurological:     Mental Status: She is alert and oriented to person, place, and time.     Motor: No weakness or tremor.  Psychiatric:        Speech: Speech normal.        Behavior: Behavior  is cooperative.     Comments: Pt rocks during entire visit.  When asked about it, she says it was due to her aches and pains.           Assessment & Plan:    Encounter Diagnoses  Name Primary?   Encounter to establish care Yes   Chronic left-sided low back pain, unspecified whether sciatica present    Screening cholesterol level    Screening for diabetes mellitus    Elevated glucose    Encounter for screening for malignant neoplasm of breast, unspecified screening modality    History of colon polyps    Obesity, unspecified classification, unspecified obesity type, unspecified whether serious comorbidity present      -will get Baseline labs -refer for screening Mammogram -Rx prednisone for the back -record request sent to NS office -record request sent to [facility] hospital for colonoscopy report -follow up with virtual appointment in about 2 or 3 weeks  she is to contact office sooner prn

## 2021-09-10 ENCOUNTER — Telehealth: Payer: Self-pay

## 2021-09-10 NOTE — Telephone Encounter (Signed)
Called client for follow up after [facility].  Client states it went well. Explained co-pays with client as well as discussed issue she had with transportation. They did bring her to the appointment however she was not given the number to call when she is finished therefore she walked home..  Client prescribed prednisone and sent to [facility]. Will transfer to [facility] to provide a one time assistance and delivery.  Food: client gets food stamps, will have some fresh fruits and veggies delivered to her from [employer].  Financial needs and DSS needs regarding granddaughter. Referred to [facility], he received referral and has not seen her yet, but will. Client notified. Housing rent, utilities.  Transportation: will arrange transportation for labs as well if needed for DSS Application.   Client urged to call for any questions or concerns   Francee Nodal RN [employer]

## 2021-09-14 ENCOUNTER — Other Ambulatory Visit: Payer: Self-pay

## 2021-09-14 ENCOUNTER — Other Ambulatory Visit (HOSPITAL_COMMUNITY)
Admission: RE | Admit: 2021-09-14 | Discharge: 2021-09-14 | Disposition: A | Discharge status: Home / Self Care | Payer: Self-pay | Source: Ambulatory Visit | Attending: Physician Assistant | Admitting: Physician Assistant

## 2021-09-14 DIAGNOSIS — Z131 Encounter for screening for diabetes mellitus: Secondary | ICD-10-CM | POA: Insufficient documentation

## 2021-09-14 DIAGNOSIS — R7309 Other abnormal glucose: Secondary | ICD-10-CM | POA: Insufficient documentation

## 2021-09-14 DIAGNOSIS — Z13220 Encounter for screening for lipoid disorders: Secondary | ICD-10-CM | POA: Insufficient documentation

## 2021-09-14 LAB — COMPREHENSIVE METABOLIC PANEL
ALT: 15 U/L (ref 0–44)
AST: 14 U/L — ABNORMAL LOW (ref 15–41)
Albumin: 4.1 g/dL (ref 3.5–5.0)
Alkaline Phosphatase: 90 U/L (ref 38–126)
Anion gap: 9 (ref 5–15)
BUN: 13 mg/dL (ref 6–20)
CO2: 25 mmol/L (ref 22–32)
Calcium: 9.2 mg/dL (ref 8.9–10.3)
Chloride: 104 mmol/L (ref 98–111)
Creatinine, Ser: 0.87 mg/dL (ref 0.44–1.00)
GFR, Estimated: >60 mL/min (ref >60)
Glucose, Bld: 107 mg/dL — ABNORMAL HIGH (ref 70–99)
Potassium: 4.8 mmol/L (ref 3.5–5.1)
Sodium: 138 mmol/L (ref 135–145)
Total Bilirubin: 0.3 mg/dL (ref 0.3–1.2)
Total Protein: 7.5 g/dL (ref 6.5–8.1)

## 2021-09-14 LAB — LIPID PANEL
Cholesterol: 259 mg/dL — ABNORMAL HIGH (ref 0–200)
HDL: 36 mg/dL — ABNORMAL LOW (ref >40)
LDL Cholesterol: 186 mg/dL — ABNORMAL HIGH (ref 0–99)
Total CHOL/HDL Ratio: 7.2 RATIO
Triglycerides: 185 mg/dL — ABNORMAL HIGH (ref <150)
VLDL: 37 mg/dL (ref 0–40)

## 2021-09-14 LAB — HEMOGLOBIN A1C
Hgb A1c MFr Bld: 5.4 % (ref 4.8–5.6)
Mean Plasma Glucose: 108.28 mg/dL

## 2021-09-15 ENCOUNTER — Other Ambulatory Visit: Payer: Self-pay

## 2021-09-15 DIAGNOSIS — Z1231 Encounter for screening mammogram for malignant neoplasm of breast: Secondary | ICD-10-CM

## 2021-09-16 ENCOUNTER — Telehealth: Payer: Self-pay

## 2021-09-16 NOTE — Telephone Encounter (Signed)
Called pt regarding mammogram appt, vm full will call back.

## 2021-09-29 ENCOUNTER — Ambulatory Visit: Payer: Self-pay | Admitting: Physician Assistant

## 2021-09-29 ENCOUNTER — Encounter: Payer: Self-pay | Admitting: Physician Assistant

## 2021-09-29 DIAGNOSIS — M67859 Other specified disorders of synovium and tendon, unspecified hip: Secondary | ICD-10-CM

## 2021-09-29 DIAGNOSIS — R937 Abnormal findings on diagnostic imaging of other parts of musculoskeletal system: Secondary | ICD-10-CM

## 2021-09-29 DIAGNOSIS — E785 Hyperlipidemia, unspecified: Secondary | ICD-10-CM

## 2021-09-29 DIAGNOSIS — Z86010 Personal history of colonic polyps: Secondary | ICD-10-CM

## 2021-09-29 DIAGNOSIS — M545 Low back pain, unspecified: Secondary | ICD-10-CM

## 2021-09-29 DIAGNOSIS — R935 Abnormal findings on diagnostic imaging of other abdominal regions, including retroperitoneum: Secondary | ICD-10-CM

## 2021-09-29 DIAGNOSIS — S73199D Other sprain of unspecified hip, subsequent encounter: Secondary | ICD-10-CM

## 2021-09-29 MED ORDER — ATORVASTATIN CALCIUM 20 MG PO TABS
20.0000 mg | ORAL_TABLET | Freq: Every day | ORAL | 1 refills | Status: DC
Start: 2021-09-29 — End: 2022-01-12

## 2021-09-29 NOTE — Progress Notes (Signed)
There were no vitals taken for this visit.   Subjective:    Patient ID: Sierra Deleon, female    DOB: 09-Feb-1961, 60 y.o.   MRN: [id number]  HPI: Sierra Deleon is a 60 y.o. female presenting on 09/29/2021 for No chief complaint on file.   HPI  This is a telemedicine appointment through [employer]  I connected with  Starla Link on 09/29/21 by a video enabled telemedicine application and verified that I am speaking with the correct person using two identifiers.   I discussed the limitations of evaluation and management by telemedicine. The patient expressed understanding and agreed to proceed.  Pt is at home.  Provider is at office.    Pt is 60yoF who presents for follow up new pt appointment last month.   She mostly has complaints about her back pain.   Record request was sent to neurosurgery she saw in the past.  Records include MRI lumbar spine and pelvis with indications of L5-S1 disc protrusion and some problems with the hips (labral tearing, synovial herniation).  Record request was sent for colonoscopy report due to pt reports having had polyps.  Request was returned (from [facility]) stating that they were only required to keep records for 11 years so they wouldn't have a colonoscopy report from approximately 2002.  Pt is aware of mammogram appointment that is scheduled  She says she is still abstaining from smoking; she quit 08/20/21  Relevant past medical, surgical, family and social history reviewed and updated as indicated. Interim medical history since our last visit reviewed. Allergies and medications reviewed and updated.    Current Outpatient Medications:    Ibuprofen-diphenhydrAMINE Cit (MOTRIN PM PO), Take 2 tablets by mouth daily., Disp: , Rfl:    naproxen (NAPROSYN) 250 MG tablet, Take by mouth 2 (two) times daily with a meal., Disp: , Rfl:    Melatonin 5 MG CAPS, Take 1 capsule by mouth at bedtime as needed (for sleep). (Patient not taking:  Reported on 09/29/2021), Disp: , Rfl:    Review of Systems  Per HPI unless specifically indicated above     Objective:    There were no vitals taken for this visit.  Wt Readings from Last 3 Encounters:  09/09/21 189 lb (85.7 kg)  01/09/21 164 lb (74.4 kg)  05/19/20 200 lb (90.7 kg)    Physical Exam Constitutional:      General: She is not in acute distress.    Appearance: She is not toxic-appearing.  HENT:     Head: Normocephalic and atraumatic.  Pulmonary:     Effort: No respiratory distress.     Comments: Pt is talking in complete sentences without dyspnea Neurological:     Mental Status: She is alert and oriented to person, place, and time.  Psychiatric:        Behavior: Behavior normal.    Results for orders placed or performed during the hospital encounter of 09/14/21  Comprehensive metabolic panel  Result Value Ref Range   Sodium 138 135 - 145 mmol/L   Potassium 4.8 3.5 - 5.1 mmol/L   Chloride 104 98 - 111 mmol/L   CO2 25 22 - 32 mmol/L   Glucose, Bld 107 (H) 70 - 99 mg/dL   BUN 13 6 - 20 mg/dL   Creatinine, Ser 0.86 0.44 - 1.00 mg/dL   Calcium 9.2 8.9 - 57.8 mg/dL   Total Protein 7.5 6.5 - 8.1 g/dL   Albumin 4.1 3.5 - 5.0  g/dL   AST 14 (L) 15 - 41 U/L   ALT 15 0 - 44 U/L   Alkaline Phosphatase 90 38 - 126 U/L   Total Bilirubin 0.3 0.3 - 1.2 mg/dL   GFR, Estimated >42 >59 mL/min   Anion gap 9 5 - 15  Hemoglobin A1c  Result Value Ref Range   Hgb A1c MFr Bld 5.4 4.8 - 5.6 %   Mean Plasma Glucose 108.28 mg/dL  Lipid panel  Result Value Ref Range   Cholesterol 259 (H) 0 - 200 mg/dL   Triglycerides 563 (H) <150 mg/dL   HDL 36 (L) >87 mg/dL   Total CHOL/HDL Ratio 7.2 RATIO   VLDL 37 0 - 40 mg/dL   LDL Cholesterol 564 (H) 0 - 99 mg/dL      Assessment & Plan:    Encounter Diagnoses  Name Primary?   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  Pt was counseled on lowfat diet and will Mail avs with lowfat diet information -Refer to GI for hx polyps (colonscopy done approx 2002- records not available) -Pt to call [facility] to get [facility] [employer] financial assistance application -Order MRI pelvis and LS spine to update studies from 2019.  Discussed with pt to Will wait until resutts from MRI to discus refer to specialist -pt to follow up 3 months to recheck lipids.  She is to contact office sooner prn

## 2021-09-30 NOTE — Patient Instructions (Signed)

## 2021-10-01 ENCOUNTER — Encounter (INDEPENDENT_AMBULATORY_CARE_PROVIDER_SITE_OTHER): Payer: Self-pay | Admitting: *Deleted

## 2021-10-01 ENCOUNTER — Telehealth: Payer: Self-pay

## 2021-10-01 NOTE — Telephone Encounter (Signed)
Spoke with pt & informed MRI scheduled for 10/16/2021 at 1:30pm.

## 2021-10-05 ENCOUNTER — Other Ambulatory Visit: Payer: Self-pay

## 2021-10-05 ENCOUNTER — Ambulatory Visit (HOSPITAL_COMMUNITY)
Admission: RE | Admit: 2021-10-05 | Discharge: 2021-10-05 | Disposition: A | Discharge status: Home / Self Care | Payer: Self-pay | Source: Ambulatory Visit | Attending: Physician Assistant | Admitting: Physician Assistant

## 2021-10-05 ENCOUNTER — Encounter (HOSPITAL_COMMUNITY): Payer: Self-pay

## 2021-10-05 DIAGNOSIS — Z1231 Encounter for screening mammogram for malignant neoplasm of breast: Secondary | ICD-10-CM | POA: Insufficient documentation

## 2021-10-05 IMAGING — MG MM DIGITAL SCREENING BILAT W/ TOMO AND CAD
8 series · 8 of 24 positions shown · non-contrast
Comparison: None.

CLINICAL DATA: Screening.

EXAM:
DIGITAL SCREENING BILATERAL MAMMOGRAM WITH TOMOSYNTHESIS AND CAD
TECHNIQUE: Bilateral screening digital craniocaudal and mediolateral oblique
mammograms were obtained. Bilateral screening digital breast
tomosynthesis was performed. The images were evaluated with
computer-aided detection.

[R MLO synth-2D]
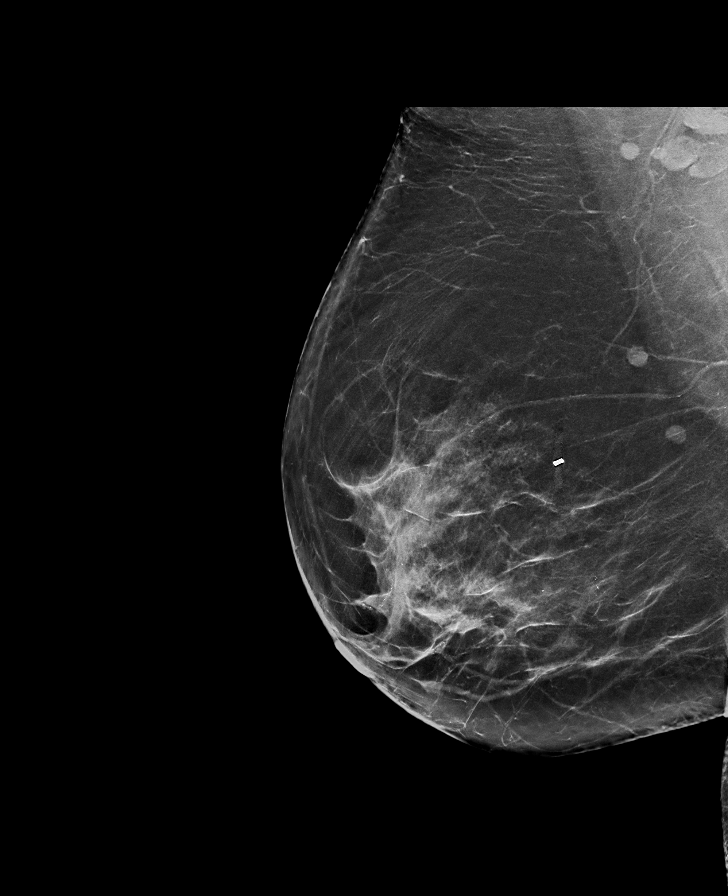

[L MLO synth-2D]
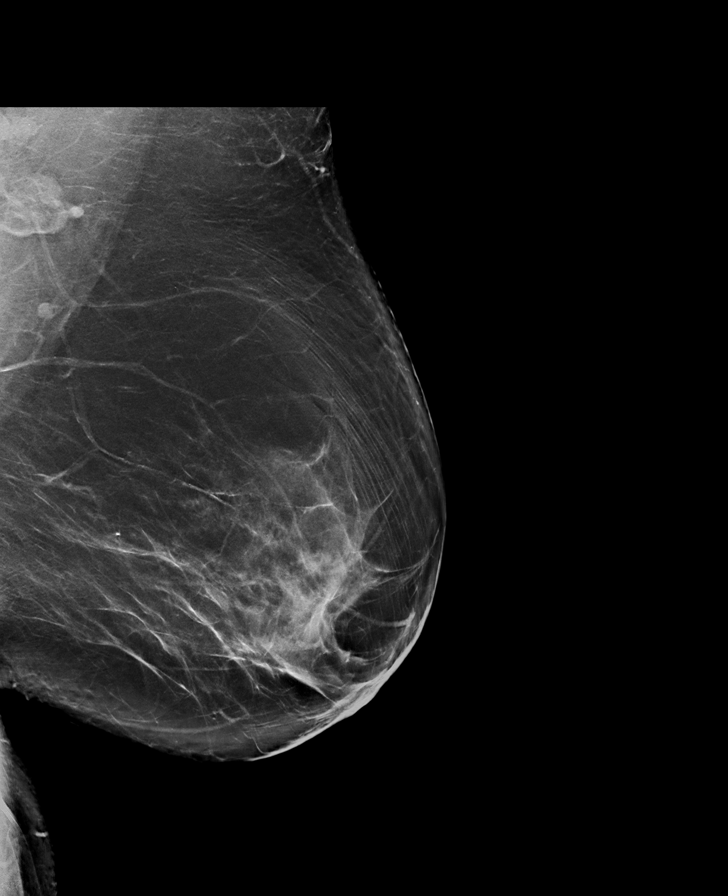

[L CC synth-2D]
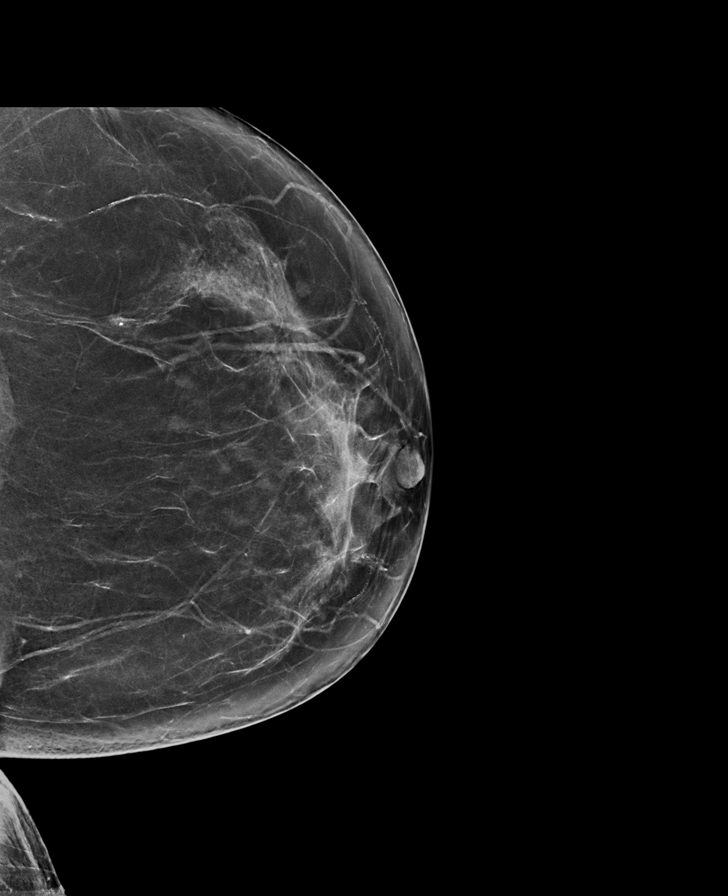

[R CC synth-2D]
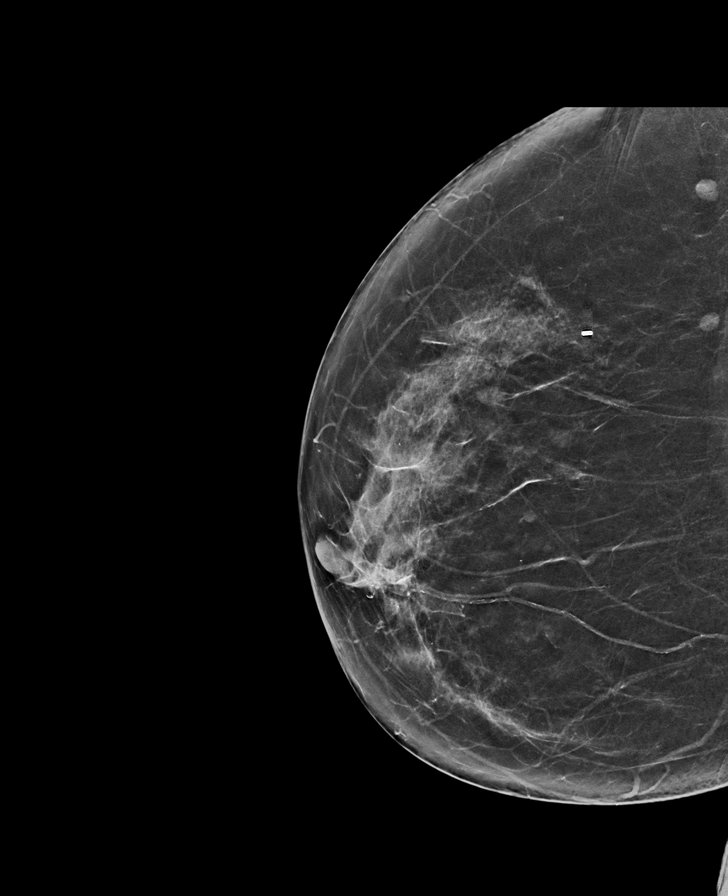

[L CC tomo · tomo slice 37/73.0]
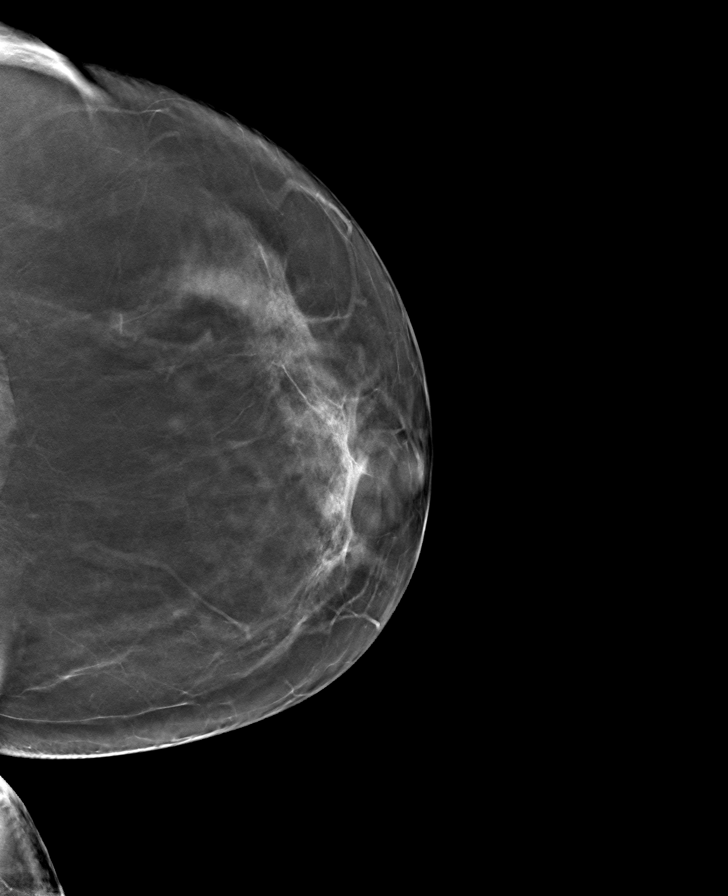

[R MLO tomo · tomo slice 43/84.0]
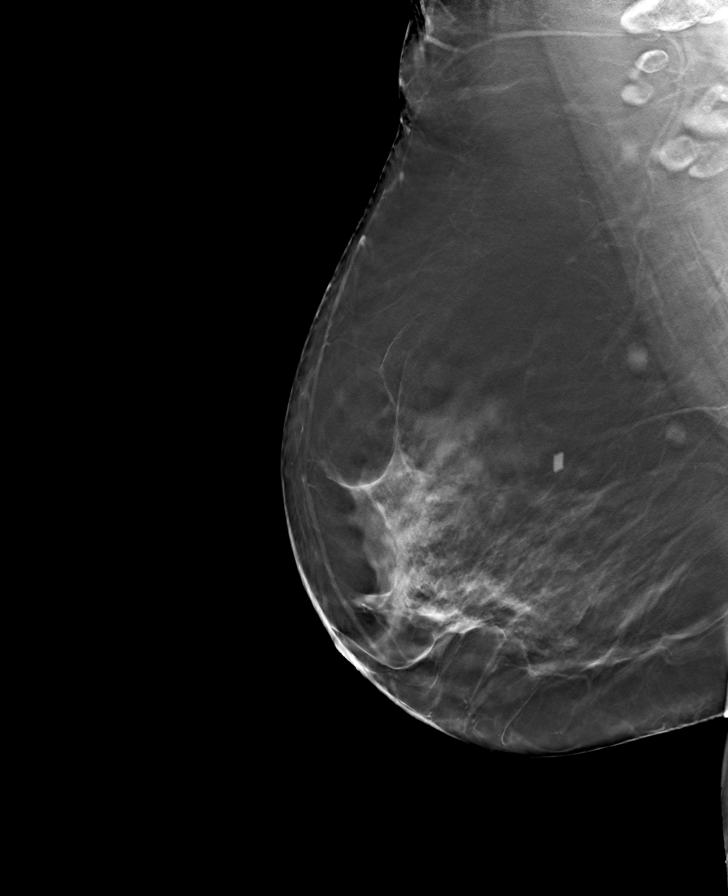

[L MLO tomo · tomo slice 46/91.0]
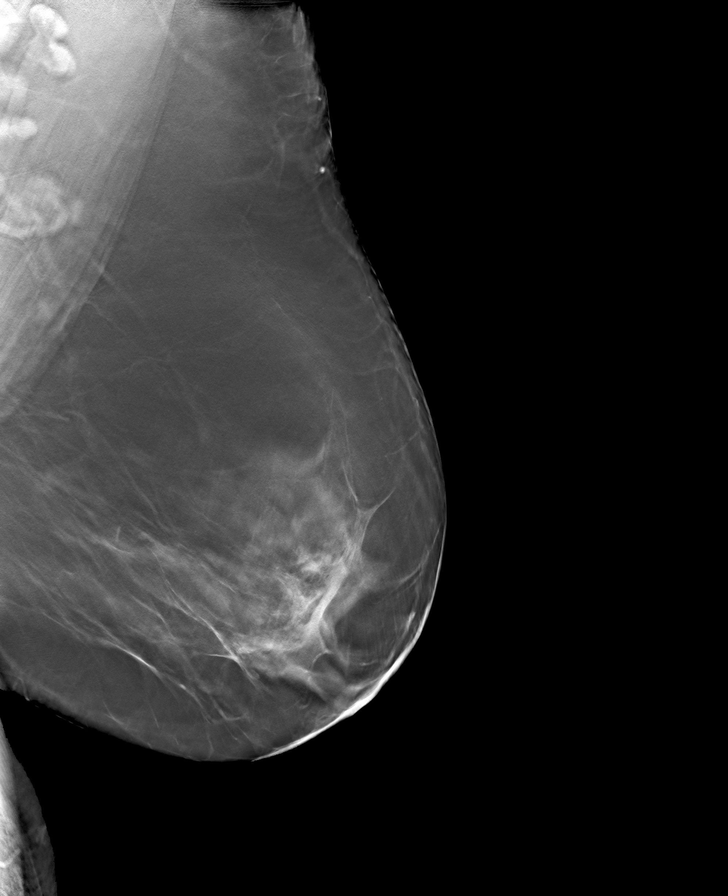

[R CC tomo · tomo slice 35/68.0]
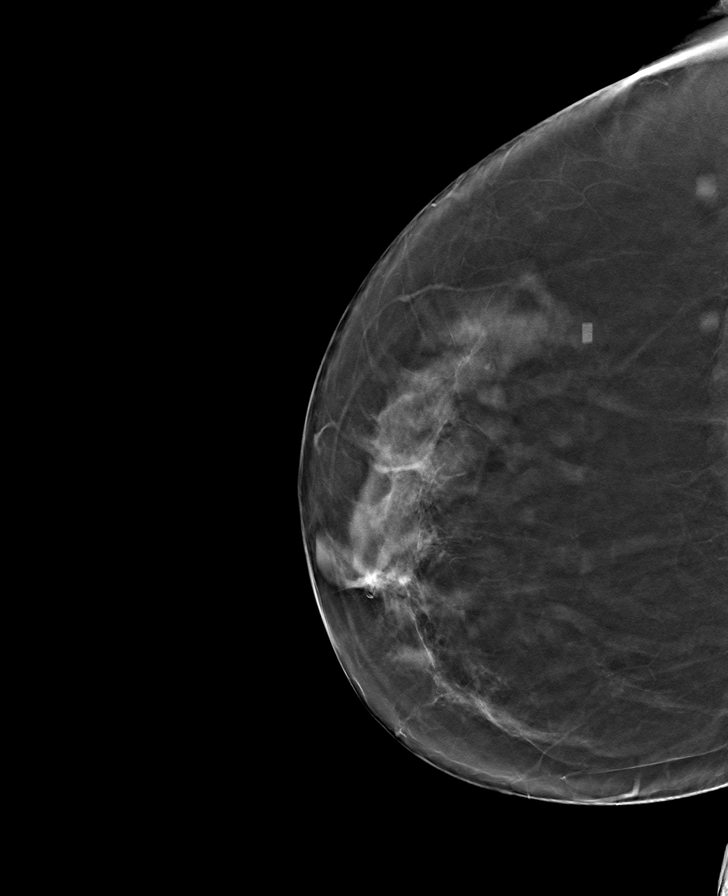

[8 of 24 positions shown; findings below may reference images not displayed]

ACR Breast Density Category b: There are scattered areas of
fibroglandular density.
FINDINGS: There are no findings suspicious for malignancy.
IMPRESSION: No mammographic evidence of malignancy. A result letter of this
screening mammogram will be mailed directly to the patient.

RECOMMENDATION:
Screening mammogram in one year. (Code:[4Q])

BI-RADS CATEGORY  1: Negative.

## 2021-10-16 ENCOUNTER — Ambulatory Visit (HOSPITAL_COMMUNITY)
Admission: RE | Admit: 2021-10-16 | Discharge: 2021-10-16 | Disposition: A | Discharge status: Home / Self Care | Payer: Self-pay | Source: Ambulatory Visit | Attending: Physician Assistant | Admitting: Physician Assistant

## 2021-10-16 ENCOUNTER — Other Ambulatory Visit: Payer: Self-pay

## 2021-10-16 DIAGNOSIS — R937 Abnormal findings on diagnostic imaging of other parts of musculoskeletal system: Secondary | ICD-10-CM

## 2021-10-16 DIAGNOSIS — M545 Low back pain, unspecified: Secondary | ICD-10-CM | POA: Insufficient documentation

## 2021-10-16 DIAGNOSIS — S73199D Other sprain of unspecified hip, subsequent encounter: Secondary | ICD-10-CM | POA: Insufficient documentation

## 2021-10-16 DIAGNOSIS — G8929 Other chronic pain: Secondary | ICD-10-CM

## 2021-10-16 DIAGNOSIS — M67859 Other specified disorders of synovium and tendon, unspecified hip: Secondary | ICD-10-CM | POA: Insufficient documentation

## 2021-10-16 DIAGNOSIS — R935 Abnormal findings on diagnostic imaging of other abdominal regions, including retroperitoneum: Secondary | ICD-10-CM | POA: Insufficient documentation

## 2021-10-16 IMAGING — MR MR PELVIS W/O CM
5 of 7 series · 32 of 48 positions shown · non-contrast
Comparison: CT [DATE]

CLINICAL DATA: Pelvic pain, post-menopausal rradiculopathy; labral
tearing and synovial herniation on previous MRI

EXAM:
MRI PELVIS WITHOUT CONTRAST
TECHNIQUE: Multiplanar multisequence MR imaging of the pelvis was performed. No
intravenous contrast was administered.

[Series 2: T1 · axial · 4.0mm · 0.78mm/px · z∈[-124,+131]mm · 7 of 52 slices shown (1 of 2)]
[im 1/52]
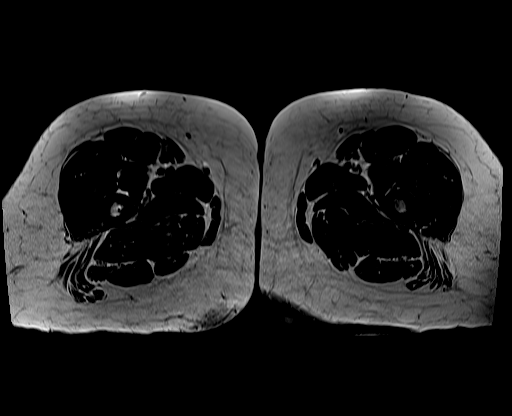
[im 9/52]
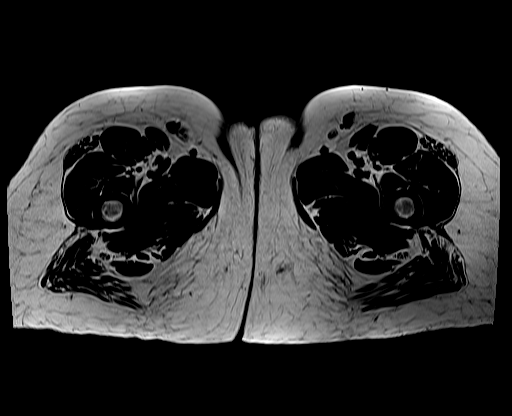
[im 18/52]
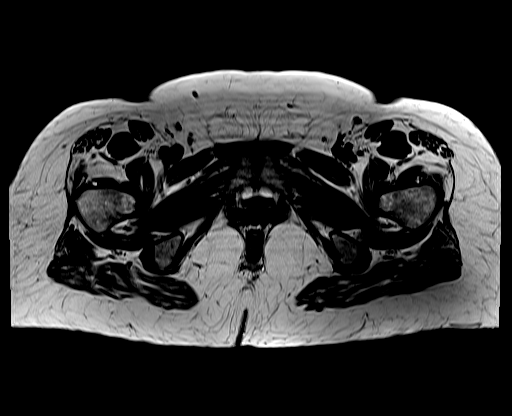
[im 26/52]
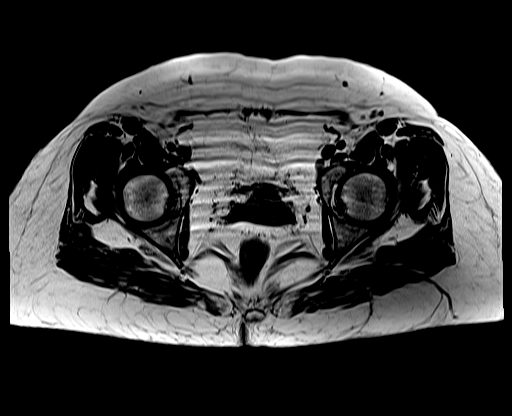
[im 35/52]
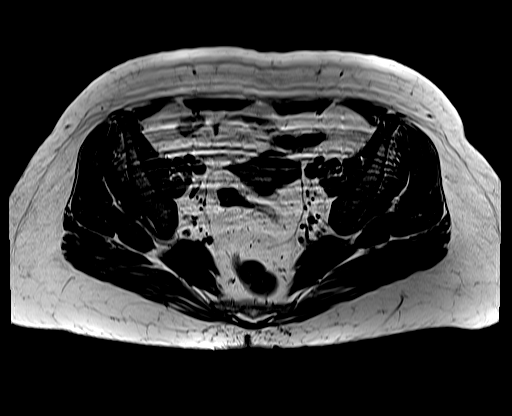
[im 43/52]
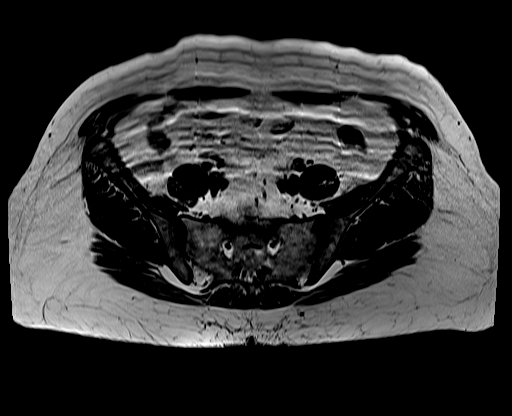
[im 52/52]
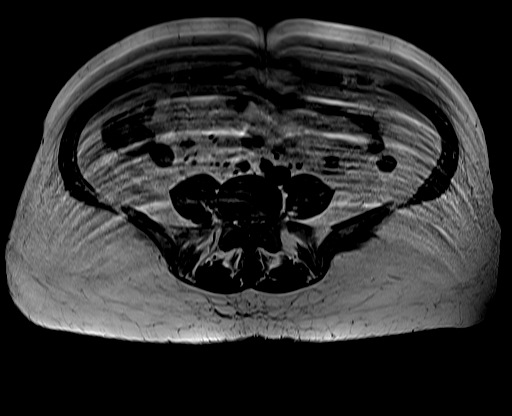

[Series 3: STIR · coronal · 4.0mm · 1.25mm/px · 5 of 40 slices shown]
[im 1/40]
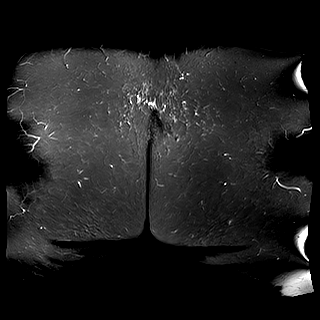
[im 10/40]
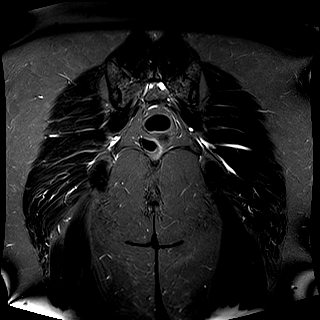
[im 20/40]
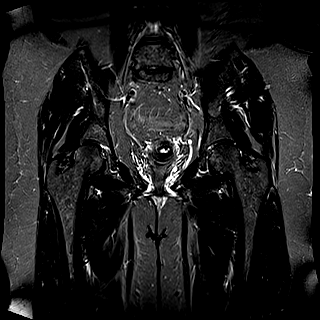
[im 30/40]
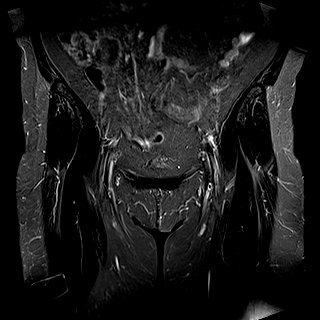
[im 40/40]
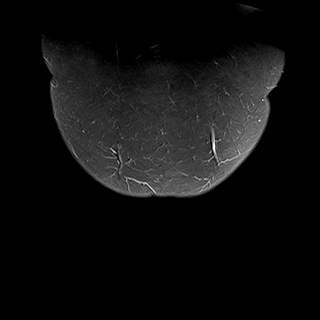

[Series 4: T2 fat-sat · axial · 4.0mm · 0.78mm/px · z∈[-124,+131]mm · 7 of 52 slices shown (1 of 2)]
[im 1/52]
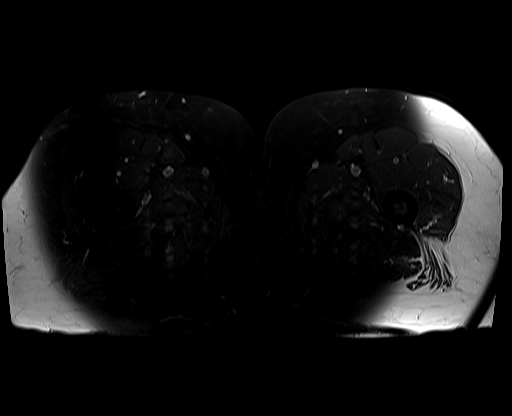
[im 9/52]
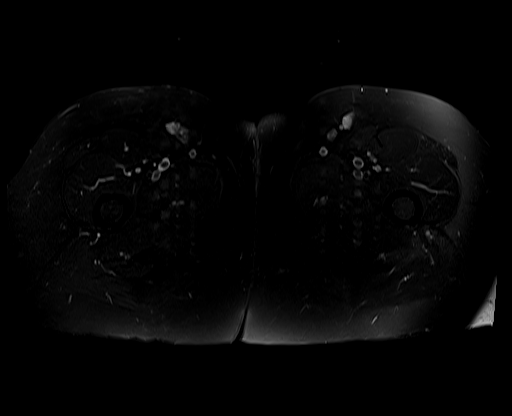
[im 18/52]
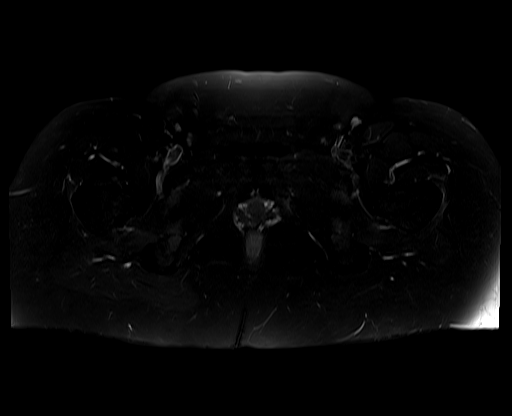
[im 26/52]
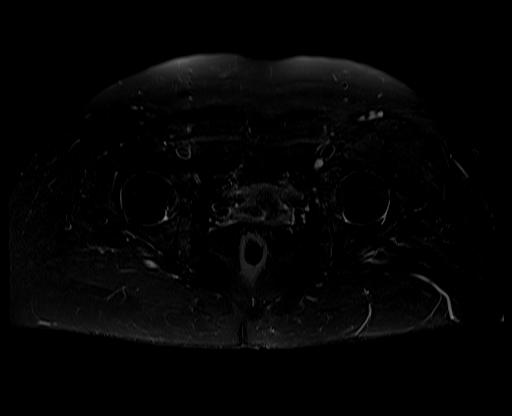
[im 35/52]
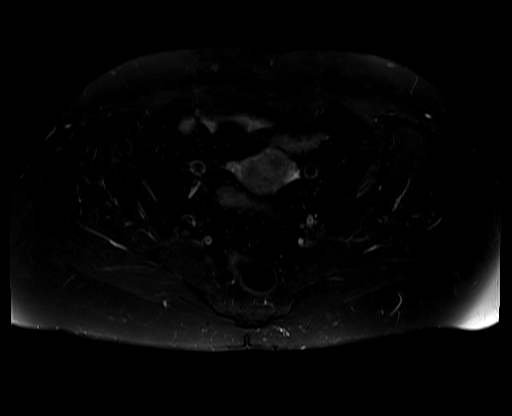
[im 43/52]
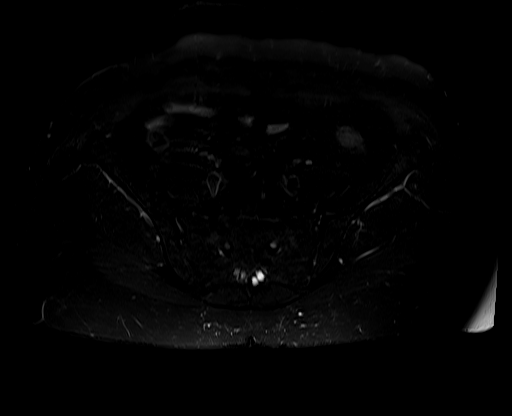
[im 52/52]
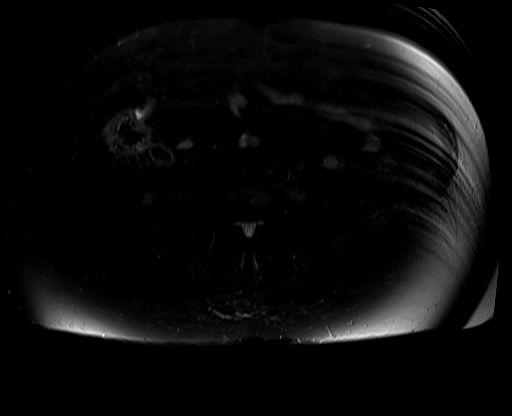

[Series 5: T1 · coronal · 4.0mm · 1.25mm/px · 5 of 40 slices shown (2 of 2)]
[im 1/40]
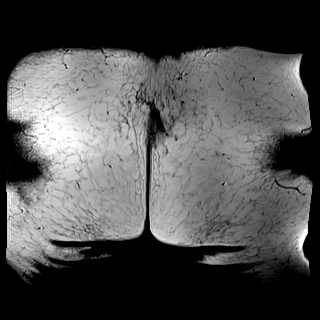
[im 10/40]
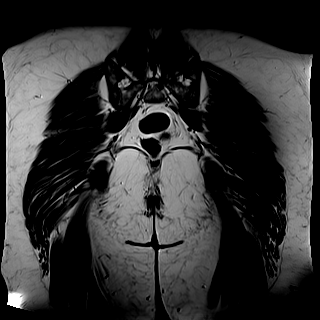
[im 20/40]
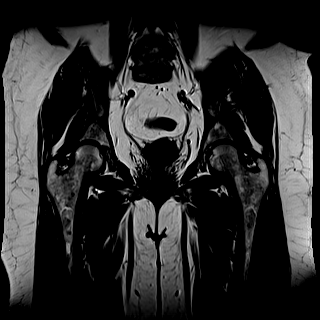
[im 30/40]
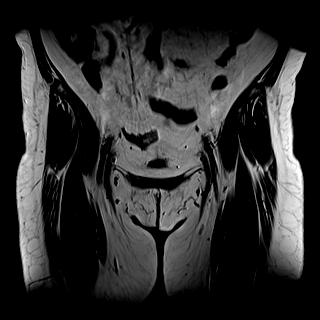
[im 40/40]
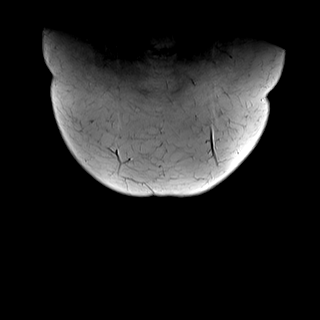

[Series 6: T2 fat-sat · sagittal · 4.0mm · 1.06mm/px · 8 of 72 slices shown (2 of 2)]
[im 1/72]
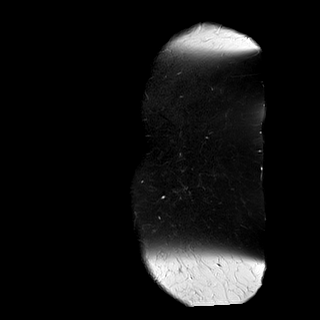
[im 8/72]
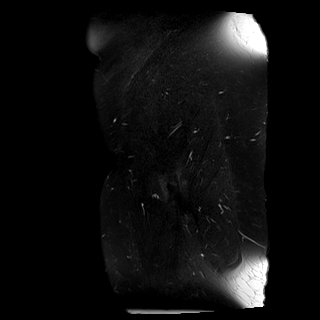
[im 24/72]
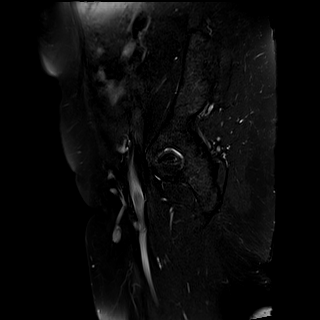
[im 32/72]
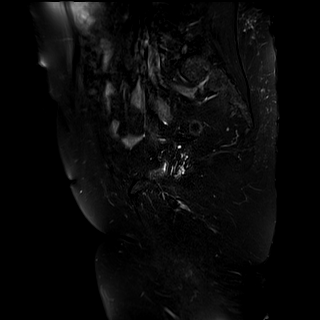
[im 40/72]
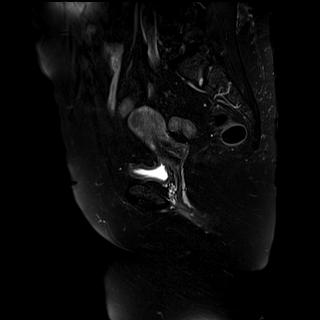
[im 48/72]
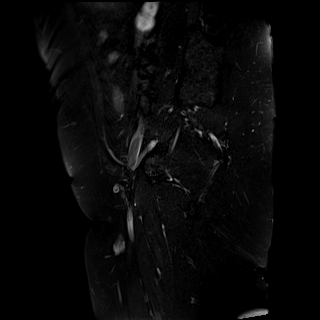
[im 64/72]
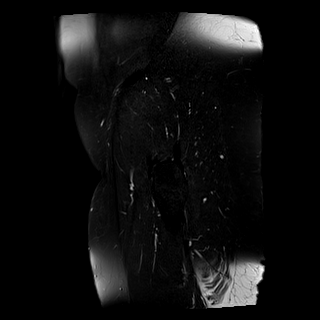
[im 72/72]
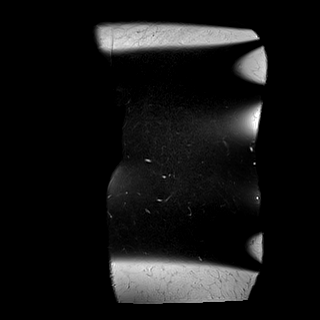

[32 of 48 positions shown; findings below may reference images not displayed]

FINDINGS: Urinary Tract:  No abnormality visualized.

Bowel:  Unremarkable visualized pelvic bowel loops.

Vascular/Lymphatic: No pathologically enlarged lymph nodes. No
significant vascular abnormality seen.

Reproductive: Small anterior fundal uterine fibroid. No adnexal
mass.

Other:  None

Musculoskeletal:

There is no evidence of acute fracture or avascular necrosis. There
is degenerative disc disease at L4-L5 and L5-S1, see separately
dictated lumbar spine MRI. The sacroiliac joints are unremarkable.

There is mild bilateral hip chondrosis. There is degenerative
anterior superior labral tearing bilaterally.

The gluteal tendons are intact bilaterally. The adductor muscles are
unremarkable. The proximal hamstrings are intact.

No evidence of significant trochanteric bursitis.

There is no significant muscle atrophy or edema. Tiny left-sided
Tarlov cysts at the level of S3.
IMPRESSION: Mild bilateral hip osteoarthritis with degenerative anterior
superior labral tearing.

## 2021-10-16 IMAGING — MR MR LUMBAR SPINE W/O CM
5 of 7 series · 37 of 48 positions shown · non-contrast
Comparison: Radiograph [DATE].

CLINICAL DATA: Chronic left-sided low back pain, unspecified
whether sciatica present; intervertebral disc disorder; abnormal
MRI, spine .

EXAM:
MRI LUMBAR SPINE WITHOUT CONTRAST
TECHNIQUE: Multiplanar, multisequence MR imaging of the lumbar spine was
performed. No intravenous contrast was administered.

[Series 9: T2 · sagittal · 4.0mm · 0.68mm/px · 3 of 15 slices shown (1 of 3)]
[im 1/15]
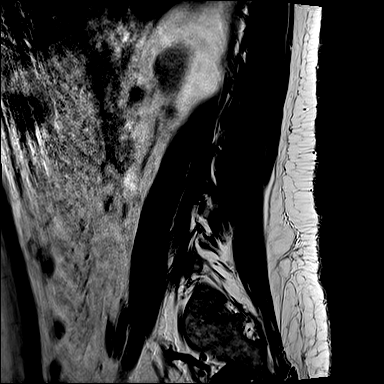
[im 8/15]
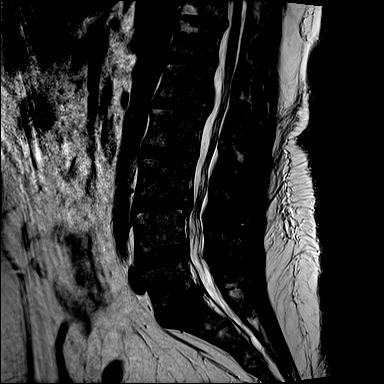
[im 15/15]
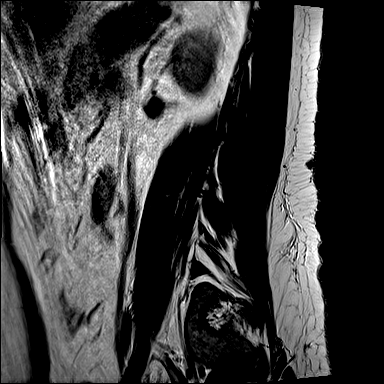

[Series 10: T1 · sagittal · 4.0mm · 0.81mm/px · 4 of 15 slices shown (1 of 2)]
[im 1/15]
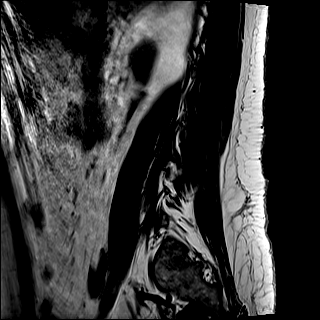
[im 5/15]
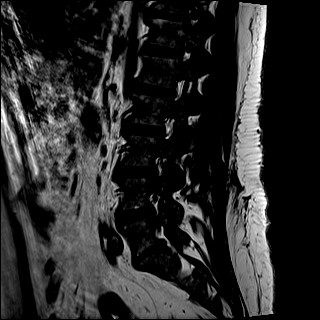
[im 10/15]
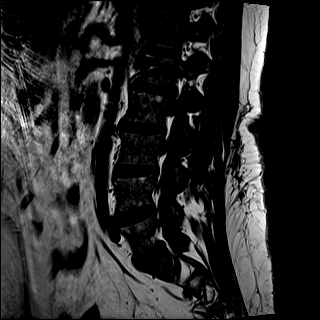
[im 15/15]
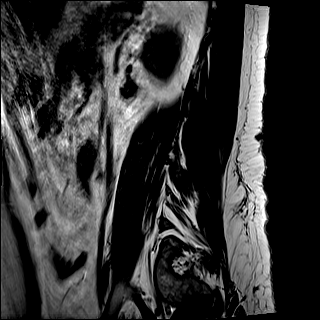

[Series 12: T2 · axial · 4.0mm · 0.70mm/px · z∈[-37,+227]mm · 11 of 43 slices shown (2 of 3)]
[im 1/43]
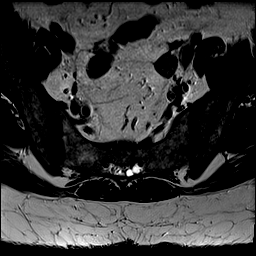
[im 5/43]
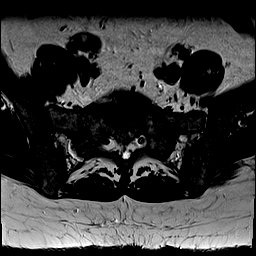
[im 9/43]
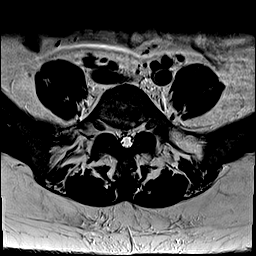
[im 13/43]
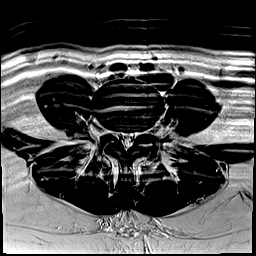
[im 17/43]
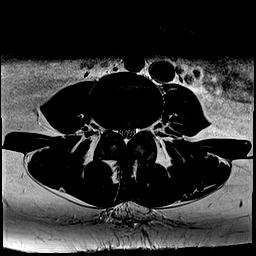
[im 22/43]
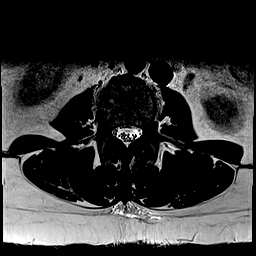
[im 26/43]
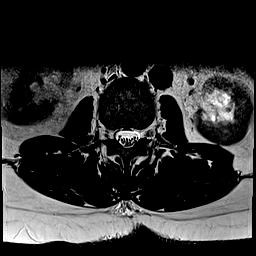
[im 30/43]
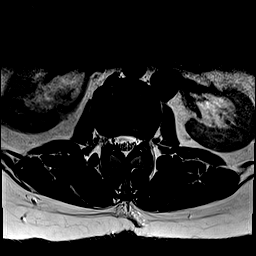
[im 34/43]
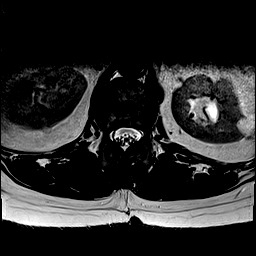
[im 38/43]
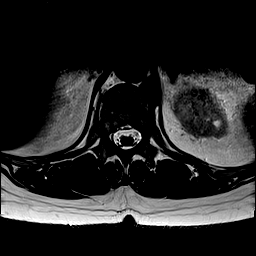
[im 43/43]
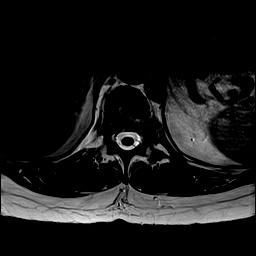

[Series 13: T1 · axial · 4.0mm · 0.35mm/px · z∈[-37,+227]mm · 8 of 43 slices shown (2 of 2)]
[im 1/43]
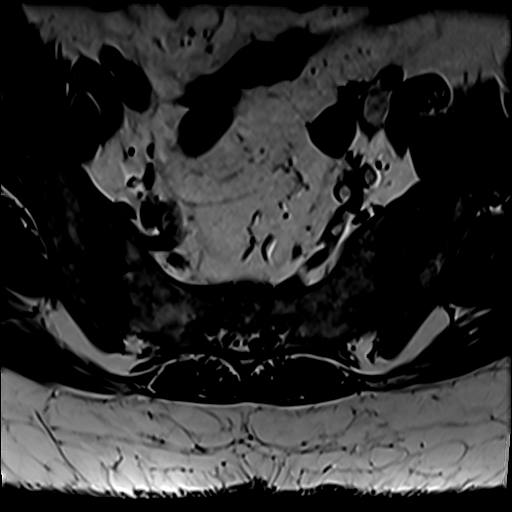
[im 9/43]
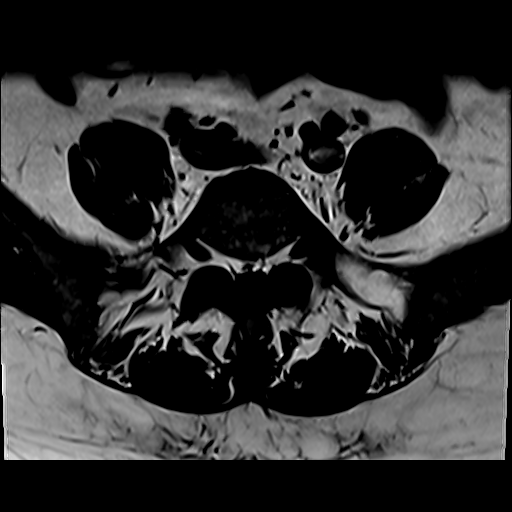
[im 13/43]
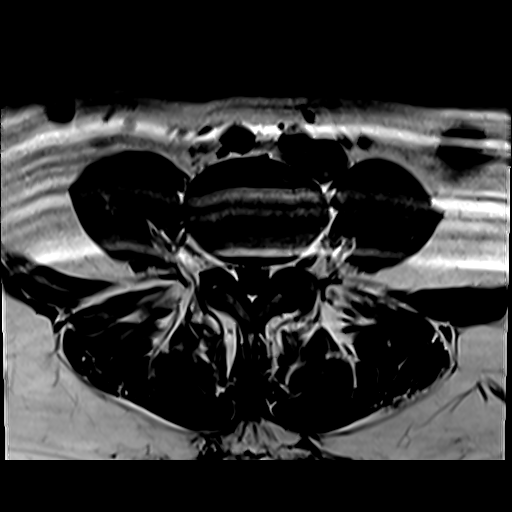
[im 17/43]
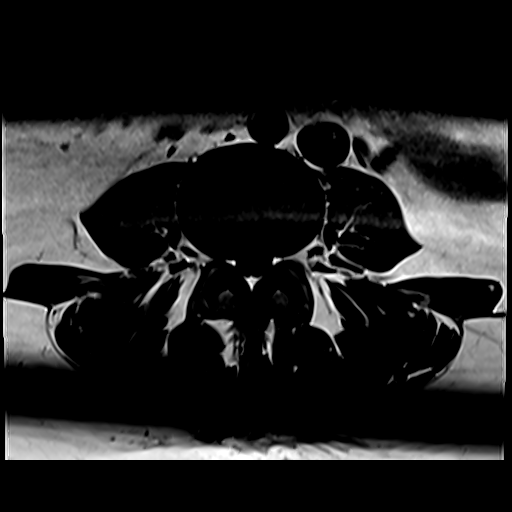
[im 26/43]
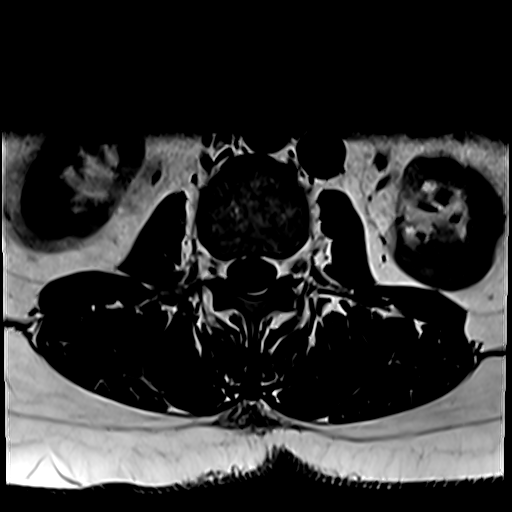
[im 30/43]
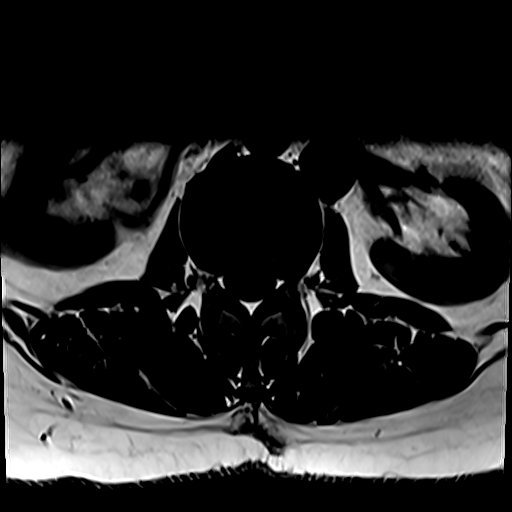
[im 34/43]
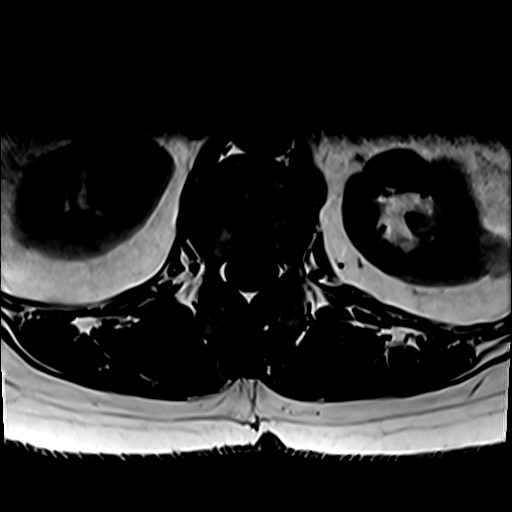
[im 43/43]
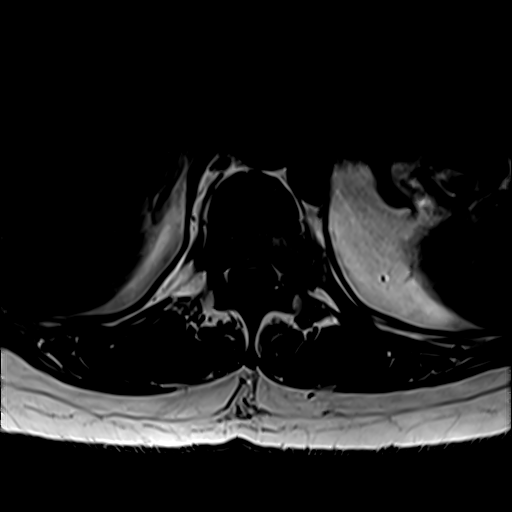

[Series 14: T2 · axial · 4.0mm · 0.70mm/px · z∈[-37,+227]mm · 11 of 43 slices shown (3 of 3)]
[im 1/43]
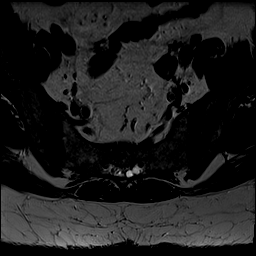
[im 5/43]
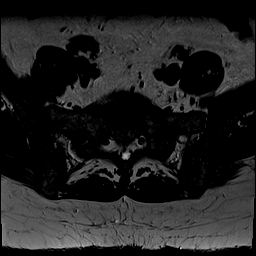
[im 9/43]
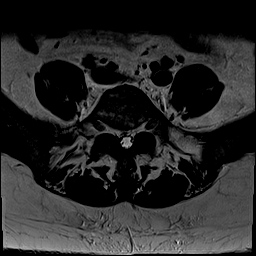
[im 13/43]
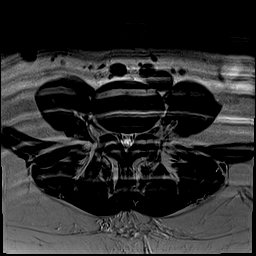
[im 17/43]
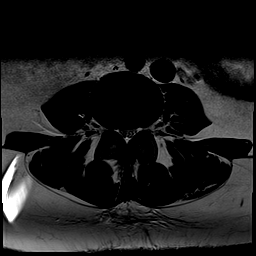
[im 22/43]
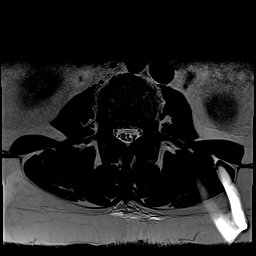
[im 26/43]
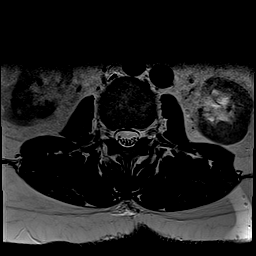
[im 30/43]
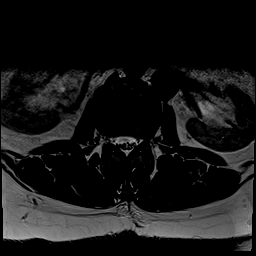
[im 34/43]
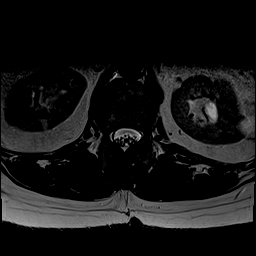
[im 38/43]
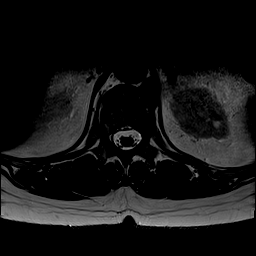
[im 43/43]
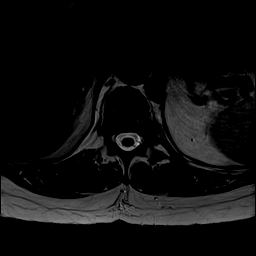

[37 of 48 positions shown; findings below may reference images not displayed]

FINDINGS: Segmentation:  Standard.

Alignment:  Trace retrolisthesis of L3 over L4.

Vertebrae:  No fracture, evidence of discitis, or bone lesion.

Conus medullaris and cauda equina: Conus extends to the T12-L1
level. Conus and cauda equina appear normal.

Paraspinal and other soft tissues: Incidentally noted is a
left-sided inferior vena cava with hemiazygos continuation.

Disc levels:

T11-12: Posterior disc protrusion causing indentation of the thecal
sac without significant spinal canal or neural foraminal stenosis.

T12-L1: Mild facet degenerative changes. No spinal canal or neural
foraminal stenosis.

L1-2: Left central/subarticular superiorly migrating disc extrusion
and mild facet degenerative changes. Findings result in narrowing of
the left subarticular zone and mild left neural foraminal narrowing.
No significant spinal canal stenosis.

L2-3: Shallow disc bulge, moderate facet degenerative changes and
ligamentum flavum redundancy without significant spinal canal or
neural foraminal stenosis.

L3-4: Disc bulge, moderate hypertrophic facet degenerative changes
ligamentum flavum redundancy resulting in moderate spinal canal
stenosis. No significant neural foraminal narrowing.

L4-5: Disc bulge, moderate hypertrophic facet degenerative changes
and ligamentum flavum redundancy resulting in mild spinal canal
stenosis. No significant neural foraminal narrowing.

L5-S1 loss of disc high, disc bulge with superimposed small central
disc protrusion and mild-to-moderate facet degenerative changes
without significant spinal canal or neural foraminal stenosis.
IMPRESSION: 1. Degenerative changes of the lumbar spine with moderate spinal
canal stenosis at L3-4 and mild at L4-5.
2. Left central/subarticular superiorly migrating disc extrusion at
L1-2 causing narrowing of the left subarticular zone and mild left
neural foraminal narrowing.

## 2021-10-20 ENCOUNTER — Encounter: Payer: Self-pay | Admitting: Physician Assistant

## 2021-10-20 ENCOUNTER — Ambulatory Visit: Payer: Self-pay | Admitting: Physician Assistant

## 2021-10-20 ENCOUNTER — Telehealth: Payer: Self-pay

## 2021-10-20 DIAGNOSIS — M48061 Spinal stenosis, lumbar region without neurogenic claudication: Secondary | ICD-10-CM

## 2021-10-20 DIAGNOSIS — G8929 Other chronic pain: Secondary | ICD-10-CM

## 2021-10-20 DIAGNOSIS — S73199D Other sprain of unspecified hip, subsequent encounter: Secondary | ICD-10-CM

## 2021-10-20 DIAGNOSIS — R937 Abnormal findings on diagnostic imaging of other parts of musculoskeletal system: Secondary | ICD-10-CM

## 2021-10-20 MED ORDER — PREDNISONE 10 MG PO TABS: ORAL_TABLET | ORAL | 0 refills | Status: DC

## 2021-10-20 MED ORDER — CYCLOBENZAPRINE HCL 10 MG PO TABS: 10.0000 mg | ORAL_TABLET | Freq: Three times a day (TID) | ORAL | 0 refills | Status: DC | PRN

## 2021-10-20 NOTE — Telephone Encounter (Signed)
Client text follow up after her [facility].  She states provider wants to refer her to Neurosurgery at [facility] and wants to know if she has [facility] financial assistance.  Client completed [facility] application and it was sent in by D. Leavy Cella. Abran Duke will check status and I will update client. Client notified of above.  Note still awaiting request submitted for rental assistance on behalf of client to [facility]. No response to date. Attempting to prevent eviction from her apartment. Will let client know when I get response from committee.   Francee Nodal RN [employer]

## 2021-10-20 NOTE — Progress Notes (Signed)
   There were no vitals taken for this visit.   Subjective:    Patient ID: Sierra Deleon, female    DOB: [birth year], 60 y.o.   MRN: [id number]  HPI: Sierra Deleon is a 60 y.o. female presenting on 10/20/2021 for No chief complaint on file.   HPI   This is a telemedicine appointment.  It was scheduled to be completed through [employer] but pt was having problems with her telephone and could not connect .  I connected with  DONITA NEWLAND on 10/20/21 by a video enabled telemedicine application and verified that I am speaking with the correct person using two identifiers.   I discussed the limitations of evaluation and management by telemedicine. The patient expressed understanding and agreed to proceed.  Pt is at home.  Provider is at office.   Pt appointment today is to review MRI results.  Pt says she is having spasms now in her back and shoulders.    The notes from when she saw NS in 2019 have not yet been scanned [facility] in [place]).  Records include MRI lumbar spine and pelvis with indications of L5-S1 disc protrusion and some problems with the hips (labral tearing, synovial herniation).  Pt is still abstaining from smoking; she quit 08/20/21.        Relevant past medical, surgical, family and social history reviewed and updated as indicated. Interim medical history since our last visit reviewed. Allergies and medications reviewed and updated.   Current Outpatient Medications:    atorvastatin (LIPITOR) 20 MG tablet, Take 1 tablet (20 mg total) by mouth daily., Disp: 90 tablet, Rfl: 1   Ibuprofen-diphenhydrAMINE Cit (MOTRIN PM PO), Take 2 tablets by mouth daily., Disp: , Rfl:    naproxen (NAPROSYN) 250 MG tablet, Take by mouth 2 (two) times daily with a meal., Disp: , Rfl:    Review of Systems  Per HPI unless specifically indicated above     Objective:    There were no vitals taken for this visit.  Wt Readings from Last 3 Encounters:  09/09/21 189 lb  (85.7 kg)  01/09/21 164 lb (74.4 kg)  05/19/20 200 lb (90.7 kg)    Physical Exam Pulmonary:     Effort: No respiratory distress.     Comments: Pt is speaking in complete sentences without dyspnea Neurological:     Mental Status: She is alert and oriented to person, place, and time.  Psychiatric:        Attention and Perception: Attention normal.        Speech: Speech normal.          Assessment & Plan:    Encounter Diagnoses  Name Primary?   Chronic left-sided low back pain, unspecified whether sciatica present Yes   Abnormal MRI, spine    Spinal stenosis of lumbar region, unspecified whether neurogenic claudication present    Tear of acetabular labrum, unspecified laterality, subsequent encounter      -Refer to NS -Pt to contact [facility] to get [id number] inancial Assistance application completed -rx Prednisone taper and Flexeril.  Pt is educated to avoid driving on flexeril due to sedation. -Pt declined local pharmacy- rx sent to [facility] -follow up January as scheduled.  She is to contact office sooner prn worsening or changes

## 2021-11-09 ENCOUNTER — Other Ambulatory Visit: Payer: Self-pay | Admitting: Physician Assistant

## 2021-11-09 ENCOUNTER — Telehealth: Payer: Self-pay | Admitting: Physician Assistant

## 2021-11-09 ENCOUNTER — Telehealth: Payer: Self-pay

## 2021-11-09 MED ORDER — CYCLOBENZAPRINE HCL 10 MG PO TABS: 10.0000 mg | ORAL_TABLET | Freq: Three times a day (TID) | ORAL | 0 refills | Status: DC | PRN

## 2021-11-09 NOTE — Congregational Nurse Program (Signed)
Visit to Client's home to get [facility] financial assistance application resigned to resubmit for upcoming visit to [facility] neurosurgery on 11/23/21. New application completed.  During visit client complained of neck pain radiating to her left shoulder and down her left arm. She reports this started Friday 11/06/21. She reports pain and decreased mobility. Grip strength weak. Client left handed and could not sign application with her left hand. She reports trouble gripping things. Reports she has had headache, but that is not out of normal for her. Speech is NOT slurred, no difficulty with speech. No facial dropping noted and gait observed steady. She denies lifting or hurting herself.  She states she called provider office earlier today. Will contact provider at [facility] to note my observation today. Called and provider office will follow up with client. Also noted that client complains of losing weight. She is eating 3 meals daily. Client has had increased stress regarding housing (rent), granddaughter is no longer living there and her disability and Medicaid were denied. Client receives food stamps but reports unable to get to the store to purchase food. Client has been taken food from [employer].  Will also plan to arrange transportation to [facility] appt for 11/23/21 as we are now in the 2 week window for [facility] transportation. Client to text date, time and location .  Faxed expidited [employer].  Francee Nodal RN [facility] [employer]/[facility].

## 2021-11-09 NOTE — Telephone Encounter (Signed)
Call from pt requesting advice for issues with right arm mobility, pain in neck & upper back that is also tingling and burning, pt states she has taken flexeril, prednisone, naproxen, used heating pad, and shower massager with no relief. Info forwarded to provider.

## 2021-11-10 ENCOUNTER — Ambulatory Visit: Payer: Self-pay | Admitting: Physician Assistant

## 2021-11-16 ENCOUNTER — Telehealth: Payer: Self-pay

## 2021-11-16 NOTE — Telephone Encounter (Signed)
Client has upcoming appointment with [facility] spine center in [place] for 11/23/21 at 3PM.  Client requests transportation assistance. [facility] [employer] contacted and transportation arranged. Client is enrolled with [facility].   Client aware that transportation will contact her by phone to number listed in Epic by the evening prior to her appt with pickup confirmation.  Francee Nodal RN  [employer]

## 2021-11-17 NOTE — Telephone Encounter (Signed)
error 

## 2021-12-22 ENCOUNTER — Telehealth: Payer: Self-pay | Admitting: Physician Assistant

## 2021-12-22 NOTE — Telephone Encounter (Signed)
Pt called reporting HA and nausea.  Pt was offered appointment for today but she declined.  Pt was scheduled to come into office tomorrow morning.

## 2021-12-23 ENCOUNTER — Ambulatory Visit: Payer: Self-pay | Admitting: Physician Assistant

## 2021-12-23 ENCOUNTER — Encounter: Payer: Self-pay | Admitting: Physician Assistant

## 2021-12-23 ENCOUNTER — Other Ambulatory Visit: Payer: Self-pay

## 2021-12-23 VITALS — BP 127/86 | HR 107 | Temp 98.2°F | Wt 194.0 lb

## 2021-12-23 DIAGNOSIS — M48061 Spinal stenosis, lumbar region without neurogenic claudication: Secondary | ICD-10-CM

## 2021-12-23 DIAGNOSIS — K2960 Other gastritis without bleeding: Secondary | ICD-10-CM

## 2021-12-23 DIAGNOSIS — F32A Depression, unspecified: Secondary | ICD-10-CM

## 2021-12-23 DIAGNOSIS — G8929 Other chronic pain: Secondary | ICD-10-CM

## 2021-12-23 DIAGNOSIS — G4486 Cervicogenic headache: Secondary | ICD-10-CM

## 2021-12-23 MED ORDER — PANTOPRAZOLE SODIUM 40 MG PO TBEC: 40.0000 mg | DELAYED_RELEASE_TABLET | Freq: Every day | ORAL | 3 refills | Status: DC

## 2021-12-23 MED ORDER — METHYLPREDNISOLONE ACETATE 80 MG/ML IJ SUSP
80.0000 mg | Freq: Once | INTRAMUSCULAR | Status: AC
  Administered 2021-12-23: 80 mg via INTRAMUSCULAR

## 2021-12-23 MED ORDER — CYCLOBENZAPRINE HCL 10 MG PO TABS: 10.0000 mg | ORAL_TABLET | Freq: Three times a day (TID) | ORAL | 0 refills | Status: DC | PRN

## 2021-12-23 NOTE — Progress Notes (Signed)
BP 127/86    Pulse (!) 107    Temp 98.2 F (36.8 C)    Wt 194 lb (88 kg)    SpO2 95%    BMI 33.30 kg/m    Subjective:    Patient ID: Sierra Deleon, female    DOB: [birth year], 61 y.o.   MRN: [id number]  HPI: Sierra Deleon is a 61 y.o. female presenting on 12/23/2021 for Headache   HPI  Chief Complaint  Patient presents with   Headache      Pt c/o HA started about 2 days before christmas.  At that time it came and went.  HA is occipital.  HA is now constant since Monday.    Her vision is blurry intermittently but that is not a new symtom; she always has that.  She has had No changes in her vision.    She feels anxious.  She feels like she has a lump low in her throat which causes her to vomit.  The lump started on and off about 2 weeks ago and is now constant.   It is inferior to her adams apple.    She eats but feels like it won't go down.  She is able to drink okay.  The lump started gradually, not all of the sudden.    She is having nausea all the time.  The nausea and emesis are not related.  The emesis is related to the knot.    She has No fever, cough, EA.  She has no oropharyngeal pain.      She has not gotten the covid vaccination.    She has not taken a covid test recently.    She does not work.  Her children support her.  She has not seen any of her children since thanksgiving.  She sometimes feels isolated.  She sometimes feels anxious and depressed.  Denies SI, HI but becomes tearful when asked about this.  She used to work but doesn't now due to pain in her back.    she has appointment with NeuroSurgery january 18 (rescheduled from December(.    She says she Takes 4-6 IBU daily due to her pain.  Her Only other medication is atorvastatin.  She denies supplements.    This appotinmet today is the first time she has been anywhere out of her home since before thanksgiving.  She gets her groceries delivered.   Relevant past medical, surgical, family and social  history reviewed and updated as indicated. Interim medical history since our last visit reviewed. Allergies and medications reviewed and updated.   CURRENT MEDS: IBU Atorvastatin    Review of Systems  Per HPI unless specifically indicated above     Objective:    BP 127/86    Pulse (!) 107    Temp 98.2 F (36.8 C)    Wt 194 lb (88 kg)    SpO2 95%    BMI 33.30 kg/m   Wt Readings from Last 3 Encounters:  12/23/21 194 lb (88 kg)  09/09/21 189 lb (85.7 kg)  01/09/21 164 lb (74.4 kg)    Physical Exam Vitals reviewed.  Constitutional:      General: She is not in acute distress.    Appearance: She is well-developed. She is not toxic-appearing.  HENT:     Head: Normocephalic and atraumatic.     Right Ear: Tympanic membrane, ear canal and external ear normal.     Left Ear: Tympanic membrane, ear canal and external  ear normal.     Mouth/Throat:     Mouth: Mucous membranes are moist.     Pharynx: Oropharynx is clear. No oropharyngeal exudate or posterior oropharyngeal erythema.  Eyes:     Extraocular Movements: Extraocular movements intact.     Right eye: No nystagmus.     Left eye: No nystagmus.     Conjunctiva/sclera: Conjunctivae normal.     Pupils: Pupils are equal, round, and reactive to light.  Cardiovascular:     Rate and Rhythm: Normal rate and regular rhythm.  Pulmonary:     Effort: Pulmonary effort is normal.     Breath sounds: Normal breath sounds.  Abdominal:     General: Bowel sounds are normal.     Palpations: Abdomen is soft. There is no mass.     Tenderness: There is no abdominal tenderness.  Musculoskeletal:     Cervical back: Neck supple. Tenderness present. No swelling, erythema or bony tenderness. Normal range of motion.     Right lower leg: No edema.     Left lower leg: No edema.  Lymphadenopathy:     Cervical: No cervical adenopathy.  Skin:    General: Skin is warm and dry.  Neurological:     Mental Status: She is alert and oriented to person,  place, and time.     Motor: No weakness or tremor.     Gait: Gait is intact.  Psychiatric:        Attention and Perception: Attention normal.        Mood and Affect: Mood is anxious.        Speech: Speech normal.        Behavior: Behavior is cooperative.     Comments: Pt is rocking during her appointment today but this is not new behavior.  Pt was tearful at times.            Assessment & Plan:    Encounter Diagnoses  Name Primary?   Steroid-induced gastritis Yes   Spinal stenosis of lumbar region, unspecified whether neurogenic claudication present    Other chronic pain    Cervicogenic headache    Anxiety and depression      NSAID gastritis -discussed with pt that this is causing her globus sensation.  She is to stop all nsaids.  She is prescribed protonix to take bid.  Pt was counseled at length to use caution with eating.  She was given reading information.  HA -discussed with pt that HA is muscular caused from the muscles that attach to her occiput from her neck.  She is given flexeril and encouraged to use heat for 10-20 minutes several times daily.  She is reminded to avoid driving on flexeril due to sedation  Chronic pain/spinal stenosis -pt has appointment with NS in 2 weeks.  As she is unable to continue with nsaids, she is given depo-medrol 80mg  IM in office today.  She can take APAP if needed.  Anxiety/depression -referred to [facility]   -pt has routine appointment January 24.  She is to contact office sooner for any changes or worsening.   She is encouraged to get covid vaccination.

## 2021-12-23 NOTE — Patient Instructions (Signed)
Gastritis, Adult Gastritis is inflammation of the stomach. There are two kinds of gastritis: Acute gastritis. This kind develops suddenly. Chronic gastritis. This kind is much more common. It develops slowly and lasts for a long time. Gastritis happens when the lining of the stomach becomes weak or gets damaged. Without treatment, gastritis can lead to stomach bleeding and ulcers. What are the causes? This condition may be caused by: An infection. Drinking too much alcohol. Certain medicines. These include steroids, antibiotics, and some over-the-counter medicines, such as aspirin or ibuprofen. Having too much acid in the stomach. Having a disease of the stomach. Other causes may include: An allergic reaction. Some cancer treatments (radiation). Smoking cigarettes or the use of products that contain nicotine or tobacco. In some cases, the cause of this condition is not known. What increases the risk? Having a disease of the intestines. Having a disease in which the body's immune system attacks the body (autoimmune disease), such as Crohn's disease. Using aspirin or ibuprofen and other NSAIDs to treat other conditions, such as heart disease or chronic pain. Stress. What are the signs or symptoms? Symptoms of this condition include: Pain or a burning sensation in the upper abdomen. Nausea. Vomiting. An uncomfortable feeling of fullness after eating. Weight loss. Bad breath. Blood in your vomit or stool (feces). In some cases, there are no symptoms. How is this diagnosed? This condition may be diagnosed based on your medical history, a physical exam, and tests. Tests may include: Your medical history and a description of your symptoms. A physical exam. Tests. These can include: Blood tests. Stool tests. A test in which a thin, flexible instrument with a light and a camera is passed down the esophagus and into the stomach (upper endoscopy). A test in which a tissue sample is  removed to look at it under a microscope (biopsy). How is this treated? This condition may be treated with medicines. The medicines that are used vary depending on the cause of the gastritis. If the condition is caused by a bacterial infection, you may be given antibiotic medicines. If the condition is caused by too much acid in the stomach, you may be given medicines called H2 blockers, proton pump inhibitors, or antacids. Treatment may also involve stopping the use of certain medicines such as aspirin or ibuprofen and other NSAIDs. Follow these instructions at home: Medicines Take over-the-counter and prescription medicines only as told by your health care provider. If you were prescribed an antibiotic medicine, take it as told by your health care provider. Do not stop taking the antibiotic even if you start to feel better. Alcohol use Do not drink alcohol if: Your health care provider tells you not to drink. You are pregnant, may be pregnant, or are planning to become pregnant. If you drink alcohol: Limit your use to: 0-1 drink a day for women. 0-2 drinks a day for men. Know how much alcohol is in your drink. In the [place] one drink equals one 12 oz bottle of beer (355 mL), one 5 oz glass of wine (148 mL), or one 1 oz glass of hard liquor (44 mL). General instructions  Eat small, frequent meals instead of large meals. Avoid foods and drinks that make your symptoms worse. Talk with your health care provider about ways to manage stress, such as getting regular exercise or practicing deep breathing, meditation, or yoga. Do not use any products that contain nicotine or tobacco. These products include cigarettes, chewing tobacco, and vaping devices, such as e-cigarettes.   If you need help quitting, ask your health care provider. Drink enough fluid to keep your urine pale yellow. Keep all follow-up visits. This is important. Contact a health care provider if: Your symptoms get worse. Your  abdominal pain gets worse. Your symptoms return after treatment. You have a fever. Get help right away if: You vomit blood or a substance that looks like coffee grounds. You have black or dark red stools. You are unable to keep fluids down. These symptoms may represent a serious problem that is an emergency. Do not wait to see if the symptoms will go away. Get medical help right away. Call your local emergency services (911 in the [place] Do not drive yourself to the hospital. Summary Gastritis is inflammation of the lining of the stomach that can occur suddenly (acute) or develop slowly over time (chronic). This condition is diagnosed with a medical history, a physical exam, or tests. This condition may be treated with medicines to treat infection or medicines to reduce the amount of acid in your stomach. Follow your health care provider's instructions about taking medicines, making changes to your diet, and knowing when to call for help. This information is not intended to replace advice given to you by your health care provider. Make sure you discuss any questions you have with your health care provider. Document Revised: 04/11/2021 Document Reviewed: 04/11/2021 Elsevier Patient Education  2022 Elsevier Inc.  

## 2021-12-29 ENCOUNTER — Other Ambulatory Visit: Payer: Self-pay | Admitting: Physician Assistant

## 2021-12-29 DIAGNOSIS — E785 Hyperlipidemia, unspecified: Secondary | ICD-10-CM

## 2021-12-30 ENCOUNTER — Ambulatory Visit: Payer: Self-pay | Admitting: Licensed Clinical Social Worker

## 2022-01-05 ENCOUNTER — Ambulatory Visit: Payer: Self-pay | Admitting: Licensed Clinical Social Worker

## 2022-01-11 ENCOUNTER — Other Ambulatory Visit: Payer: Self-pay

## 2022-01-11 ENCOUNTER — Other Ambulatory Visit (HOSPITAL_COMMUNITY)
Admission: RE | Admit: 2022-01-11 | Discharge: 2022-01-11 | Disposition: A | Discharge status: Home / Self Care | Payer: Self-pay | Source: Ambulatory Visit | Attending: Physician Assistant | Admitting: Physician Assistant

## 2022-01-11 DIAGNOSIS — E785 Hyperlipidemia, unspecified: Secondary | ICD-10-CM

## 2022-01-11 LAB — LIPID PANEL
Cholesterol: 178 mg/dL (ref 0–200)
HDL: 44 mg/dL (ref >40)
LDL Cholesterol: 112 mg/dL — ABNORMAL HIGH (ref 0–99)
Total CHOL/HDL Ratio: 4 RATIO
Triglycerides: 109 mg/dL (ref <150)
VLDL: 22 mg/dL (ref 0–40)

## 2022-01-11 LAB — HEPATIC FUNCTION PANEL
ALT: 21 U/L (ref 0–44)
AST: 14 U/L — ABNORMAL LOW (ref 15–41)
Albumin: 4.2 g/dL (ref 3.5–5.0)
Alkaline Phosphatase: 79 U/L (ref 38–126)
Bilirubin, Direct: 0.1 mg/dL (ref 0.0–0.2)
Indirect Bilirubin: 0.2 mg/dL — ABNORMAL LOW (ref 0.3–0.9)
Total Bilirubin: 0.3 mg/dL (ref 0.3–1.2)
Total Protein: 7.3 g/dL (ref 6.5–8.1)

## 2022-01-12 ENCOUNTER — Ambulatory Visit: Payer: Self-pay | Admitting: Physician Assistant

## 2022-01-12 ENCOUNTER — Encounter: Payer: Self-pay | Admitting: Physician Assistant

## 2022-01-12 DIAGNOSIS — E785 Hyperlipidemia, unspecified: Secondary | ICD-10-CM

## 2022-01-12 DIAGNOSIS — G959 Disease of spinal cord, unspecified: Secondary | ICD-10-CM

## 2022-01-12 DIAGNOSIS — T380X5A Adverse effect of glucocorticoids and synthetic analogues, initial encounter: Secondary | ICD-10-CM

## 2022-01-12 DIAGNOSIS — F419 Anxiety disorder, unspecified: Secondary | ICD-10-CM

## 2022-01-12 MED ORDER — ATORVASTATIN CALCIUM 20 MG PO TABS: 20.0000 mg | ORAL_TABLET | Freq: Every day | ORAL | 1 refills | Status: AC

## 2022-01-12 MED ORDER — OMEPRAZOLE 40 MG PO CPDR: 40.0000 mg | DELAYED_RELEASE_CAPSULE | Freq: Two times a day (BID) | ORAL | 0 refills | Status: DC

## 2022-01-12 NOTE — Progress Notes (Signed)
There were no vitals taken for this visit.   Subjective:    Patient ID: Sierra Deleon, female    DOB: 08/13/1961, 61 y.o.   MRN: [id number]  HPI: Sierra Deleon is a 61 y.o. female presenting on 01/12/2022 for No chief complaint on file.   HPI  This is a telemedicine appointment through [employer].  I connected with  Sierra Deleon on 01/12/22 by a video enabled telemedicine application and verified that I am speaking with the correct person using two identifiers.   I discussed the limitations of evaluation and management by telemedicine. The patient expressed understanding and agreed to proceed.  Pt is at home.  Provider is at office.    Pt is 60yoF with appointment to follow up gastritis and hyperlipidemia.   She never got the protonix but she did stop IBU.  She still has intermittent globus.   She didn't get the rx due to cost.   She was seen by Neurosurgery 01-06-22  with diagnosis of myelopathy.  There was some suspicion for possible MS.  MRIs were Ordered of brain, c-spine and t-spine  Pt missed her appointment with [facility].  She says it was due to transportation issues.     Relevant past medical, surgical, family and social history reviewed and updated as indicated. Interim medical history since our last visit reviewed. Allergies and medications reviewed and updated.   Current Outpatient Medications:    atorvastatin (LIPITOR) 20 MG tablet, Take 1 tablet (20 mg total) by mouth daily., Disp: 90 tablet, Rfl: 1   cyclobenzaprine (FLEXERIL) 10 MG tablet, Take 1 tablet (10 mg total) by mouth 3 (three) times daily as needed for muscle spasms. (Patient not taking: Reported on 01/12/2022), Disp: 30 tablet, Rfl: 0   pantoprazole (PROTONIX) 40 MG tablet, Take 1 tablet (40 mg total) by mouth daily. (Patient not taking: Reported on 01/12/2022), Disp: 30 tablet, Rfl: 3   Review of Systems  Per HPI unless specifically indicated above     Objective:    There were no vitals taken  for this visit.  Wt Readings from Last 3 Encounters:  12/23/21 194 lb (88 kg)  09/09/21 189 lb (85.7 kg)  01/09/21 164 lb (74.4 kg)    Physical Exam Constitutional:      General: She is not in acute distress.    Appearance: She is not toxic-appearing.  HENT:     Head: Normocephalic and atraumatic.  Pulmonary:     Effort: Pulmonary effort is normal. No respiratory distress.     Comments: Pt is talking in complete sentences without dyspnea Neurological:     Mental Status: She is alert and oriented to person, place, and time.  Psychiatric:        Behavior: Behavior normal.    Results for orders placed or performed during the hospital encounter of 01/11/22  Hepatic function panel  Result Value Ref Range   Total Protein 7.3 6.5 - 8.1 g/dL   Albumin 4.2 3.5 - 5.0 g/dL   AST 14 (L) 15 - 41 U/L   ALT 21 0 - 44 U/L   Alkaline Phosphatase 79 38 - 126 U/L   Total Bilirubin 0.3 0.3 - 1.2 mg/dL   Bilirubin, Direct 0.1 0.0 - 0.2 mg/dL   Indirect Bilirubin 0.2 (L) 0.3 - 0.9 mg/dL  Lipid panel  Result Value Ref Range   Cholesterol 178 0 - 200 mg/dL   Triglycerides 254 <982 mg/dL   HDL 44 >64 mg/dL  Total CHOL/HDL Ratio 4.0 RATIO   VLDL 22 0 - 40 mg/dL   LDL Cholesterol 300 (H) 0 - 99 mg/dL      Assessment & Plan:   Encounter Diagnoses  Name Primary?   Hyperlipidemia, unspecified hyperlipidemia type Yes   Steroid-induced gastritis    Anxiety and depression    Myelopathy (HCC)        -Reviewed labs with pt  -prescriptions for Omeprazole were sent to [facility] and refilled atorvastatin -pt to Continue with NeuroSurgery and their recommedations- MRI 02-11-22 and follow up appointment  02-12-22 -pt to follow up here 1 month to recheck gastritis.  She was Re-scheulded with [facility]

## 2022-01-19 ENCOUNTER — Ambulatory Visit: Payer: Self-pay | Admitting: Licensed Clinical Social Worker

## 2022-01-19 ENCOUNTER — Other Ambulatory Visit: Payer: Self-pay

## 2022-01-19 DIAGNOSIS — F419 Anxiety disorder, unspecified: Secondary | ICD-10-CM

## 2022-01-19 NOTE — Progress Notes (Signed)
[facility] engaged patient in initial [facility] session. [facility] provided active listening and validation as patient shared about depression and anxiety symptoms and "rough" childhood. Client shared BH symptoms most relate to pain in body and the anxiety of not having a diagnosis for it currently. No SI/HI.

## 2022-01-27 ENCOUNTER — Encounter: Payer: Self-pay | Admitting: Physician Assistant

## 2022-02-17 ENCOUNTER — Encounter: Payer: Self-pay | Admitting: Physician Assistant

## 2022-02-17 ENCOUNTER — Ambulatory Visit: Payer: Self-pay | Admitting: Physician Assistant

## 2022-02-17 VITALS — BP 130/88 | HR 106 | Temp 97.3°F | Wt 202.0 lb

## 2022-02-17 DIAGNOSIS — G8929 Other chronic pain: Secondary | ICD-10-CM

## 2022-02-17 DIAGNOSIS — E785 Hyperlipidemia, unspecified: Secondary | ICD-10-CM

## 2022-02-17 DIAGNOSIS — F32A Depression, unspecified: Secondary | ICD-10-CM

## 2022-02-17 DIAGNOSIS — T380X5A Adverse effect of glucocorticoids and synthetic analogues, initial encounter: Secondary | ICD-10-CM

## 2022-02-17 DIAGNOSIS — K2960 Other gastritis without bleeding: Secondary | ICD-10-CM

## 2022-02-17 DIAGNOSIS — F419 Anxiety disorder, unspecified: Secondary | ICD-10-CM

## 2022-02-17 MED ORDER — GABAPENTIN 300 MG PO CAPS: 300.0000 mg | ORAL_CAPSULE | Freq: Two times a day (BID) | ORAL | 0 refills | Status: DC

## 2022-02-17 NOTE — Patient Instructions (Signed)
COVID-19 vaccination significantly lowers your risk of severe illness, hospitalization, and death if you get infected. Compared to people who are up to date with their COVID-19 vaccinations, unvaccinated people aremore likely to get COVID-19, much more likely to be hospitalized with COVID-19, and much more likely to die from COVID-19. ?Like all vaccines, COVID-19 vaccines are not 100% effective at preventing infection. Some people who are up to date with their COVID-19 vaccinations will get COVID-19 breakthrough infection. However, staying up to date with your COVID-19 vaccinations means that you are less likely to have a breakthrough infection and, if you do get sick, you are less likely to get severely ill or die. Staying up to date with COVID-19 vaccination also means you are less likely to spread the disease to others and increases your protection against new variants of SARS-CoV-2, the virus that causes COVID-19. ? ?

## 2022-02-17 NOTE — Progress Notes (Signed)
? ?BP 130/88   Pulse (!) 106   Temp (!) 97.3 ?F (36.3 ?C)   Wt 202 lb (91.6 kg)   SpO2 97%   BMI 34.67 kg/m?   ? ?Subjective:  ? ? Patient ID: Sierra Deleon, female    DOB: 1961-10-27, 61 y.o.   MRN: [id number] ? ?HPI: ?Sierra Deleon is a 61 y.o. female presenting on 02/17/2022 for Follow-up ? ? ?HPI ? ? ?Pt is 60yoF with appointment to follow up gastritis.  She had been prescribed protonix but didn't get it.  She was then prescribed omeprazole at appointment 01/12/22.   She has been taking it. bid   It has helped.  She still occassionally has feeling of something in her throat but not as bad or as frequently.     She has been avoiding nsaids.   ? ?She saw NeuroSurgery on 02/12/22 who referred her to neurology.   She has appointment may 1.   She is also getting EMG that day as well. ? ?She is seeing  [facility] for counseling. ? ?She says her right thigh feels numb Laterally and she has pain posteriorly  She has tried APAP.  And cold compresses.  Neither Helps much.   ? ?pt was asked about GI referral placed in October.  The only thing waiting for that is for her to complete a letter and return it to GI office.  She says she is in too much pain to do that.  Discussed cancelling referral at this time and she agrees.   ? ? ? ? ?Relevant past medical, surgical, family and social history reviewed and updated as indicated. Interim medical history since our last visit reviewed. ?Allergies and medications reviewed and updated. ? ? ?Current Outpatient Medications:  ?  atorvastatin (LIPITOR) 20 MG tablet, Take 1 tablet (20 mg total) by mouth daily., Disp: 90 tablet, Rfl: 1 ?  omeprazole (PRILOSEC) 40 MG capsule, Take 1 capsule (40 mg total) by mouth in the morning and at bedtime., Disp: 180 capsule, Rfl: 0 ? ? ? ?Review of Systems ? ?Per HPI unless specifically indicated above ? ?   ?Objective:  ?  ?BP 130/88   Pulse (!) 106   Temp (!) 97.3 ?F (36.3 ?C)   Wt 202 lb (91.6 kg)   SpO2 97%   BMI 34.67 kg/m?   ?Wt Readings  from Last 3 Encounters:  ?02/17/22 202 lb (91.6 kg)  ?12/23/21 194 lb (88 kg)  ?09/09/21 189 lb (85.7 kg)  ?  ?Physical Exam ?Vitals reviewed.  ?Constitutional:   ?   General: She is not in acute distress. ?   Appearance: She is well-developed. She is obese. She is not ill-appearing.  ?HENT:  ?   Head: Normocephalic and atraumatic.  ?Cardiovascular:  ?   Rate and Rhythm: Normal rate and regular rhythm.  ?Pulmonary:  ?   Effort: Pulmonary effort is normal.  ?   Breath sounds: Normal breath sounds.  ?Abdominal:  ?   General: Bowel sounds are normal.  ?   Palpations: Abdomen is soft. There is no mass.  ?   Tenderness: There is no abdominal tenderness.  ?Musculoskeletal:  ?   Cervical back: Neck supple.  ?   Right lower leg: No edema.  ?   Left lower leg: No edema.  ?Lymphadenopathy:  ?   Cervical: No cervical adenopathy.  ?Skin: ?   General: Skin is warm and dry.  ?Neurological:  ?   Mental Status: She is  alert and oriented to person, place, and time.  ?Psychiatric:     ?   Behavior: Behavior normal.  ? ? ? ? ? ?   ?Assessment & Plan:  ? ? ?Encounter Diagnoses  ?Name Primary?  ? Steroid-induced gastritis Yes  ? Anxiety and depression   ? Hyperlipidemia, unspecified hyperlipidemia type   ? Other chronic pain   ? ? ? ?-pt to Continue omeprazole bid.  Pt to continue to avoid nsaids ?-Add gabapentin ?-pt to see Neurologist MAy 1 as scheduled ?-pt to see [facility] next week for counseling as schedule ?-pt is educated and encouraged to get Covid vaccination ?-will re-address re-referring to GI when she is more agreeable for that ?-pt to follow up end of April with recheck lipids at that time ? ?

## 2022-02-23 ENCOUNTER — Ambulatory Visit: Payer: Self-pay | Admitting: Licensed Clinical Social Worker

## 2022-03-03 ENCOUNTER — Telehealth: Payer: Self-pay | Admitting: Physician Assistant

## 2022-03-03 MED ORDER — GABAPENTIN 300 MG PO CAPS: 300.0000 mg | ORAL_CAPSULE | Freq: Three times a day (TID) | ORAL | 0 refills | Status: DC

## 2022-03-03 NOTE — Telephone Encounter (Signed)
Pt called and reported pain to CMA.  Pt was called back by meself.  She reports pain continuing for past 2 weeks since seen in office.  Pain in ankles, calves, backs of legs, buttock.  She says the gabapentin (rx 300mg  bid) isn't helping at all.  She is also using APAP, heat and ice.  She says IBU helped but that was discontinued due to her having NSAID- induced gastritis.  She says the GI symptoms are improved now that she is taking her ppi and stopped the IBU.    Will increase the gabapentin to tid.  Rx sent to [facility].   ?Pt says she now has medicare.  Pt is notified that she is no longer eligible at [facility] due to she has insurance.  Encouraged her to call today to get appointment to establish with new PCP.  She states understanding. ?

## 2022-03-22 ENCOUNTER — Ambulatory Visit (HOSPITAL_COMMUNITY)
Admission: RE | Admit: 2022-03-22 | Discharge: 2022-03-22 | Disposition: A | Discharge status: Home / Self Care | Payer: Medicare Other | Source: Ambulatory Visit | Attending: Gerontology | Admitting: Gerontology

## 2022-03-22 ENCOUNTER — Other Ambulatory Visit (HOSPITAL_COMMUNITY): Payer: Self-pay | Admitting: Gerontology

## 2022-03-22 DIAGNOSIS — R059 Cough, unspecified: Secondary | ICD-10-CM | POA: Diagnosis not present

## 2022-03-22 IMAGING — DX DG CHEST 2V
2 series · 2 of 2 positions shown · non-contrast
Comparison: None.

CLINICAL DATA: Cough.

EXAM:
CHEST - 2 VIEW

[chest pa]
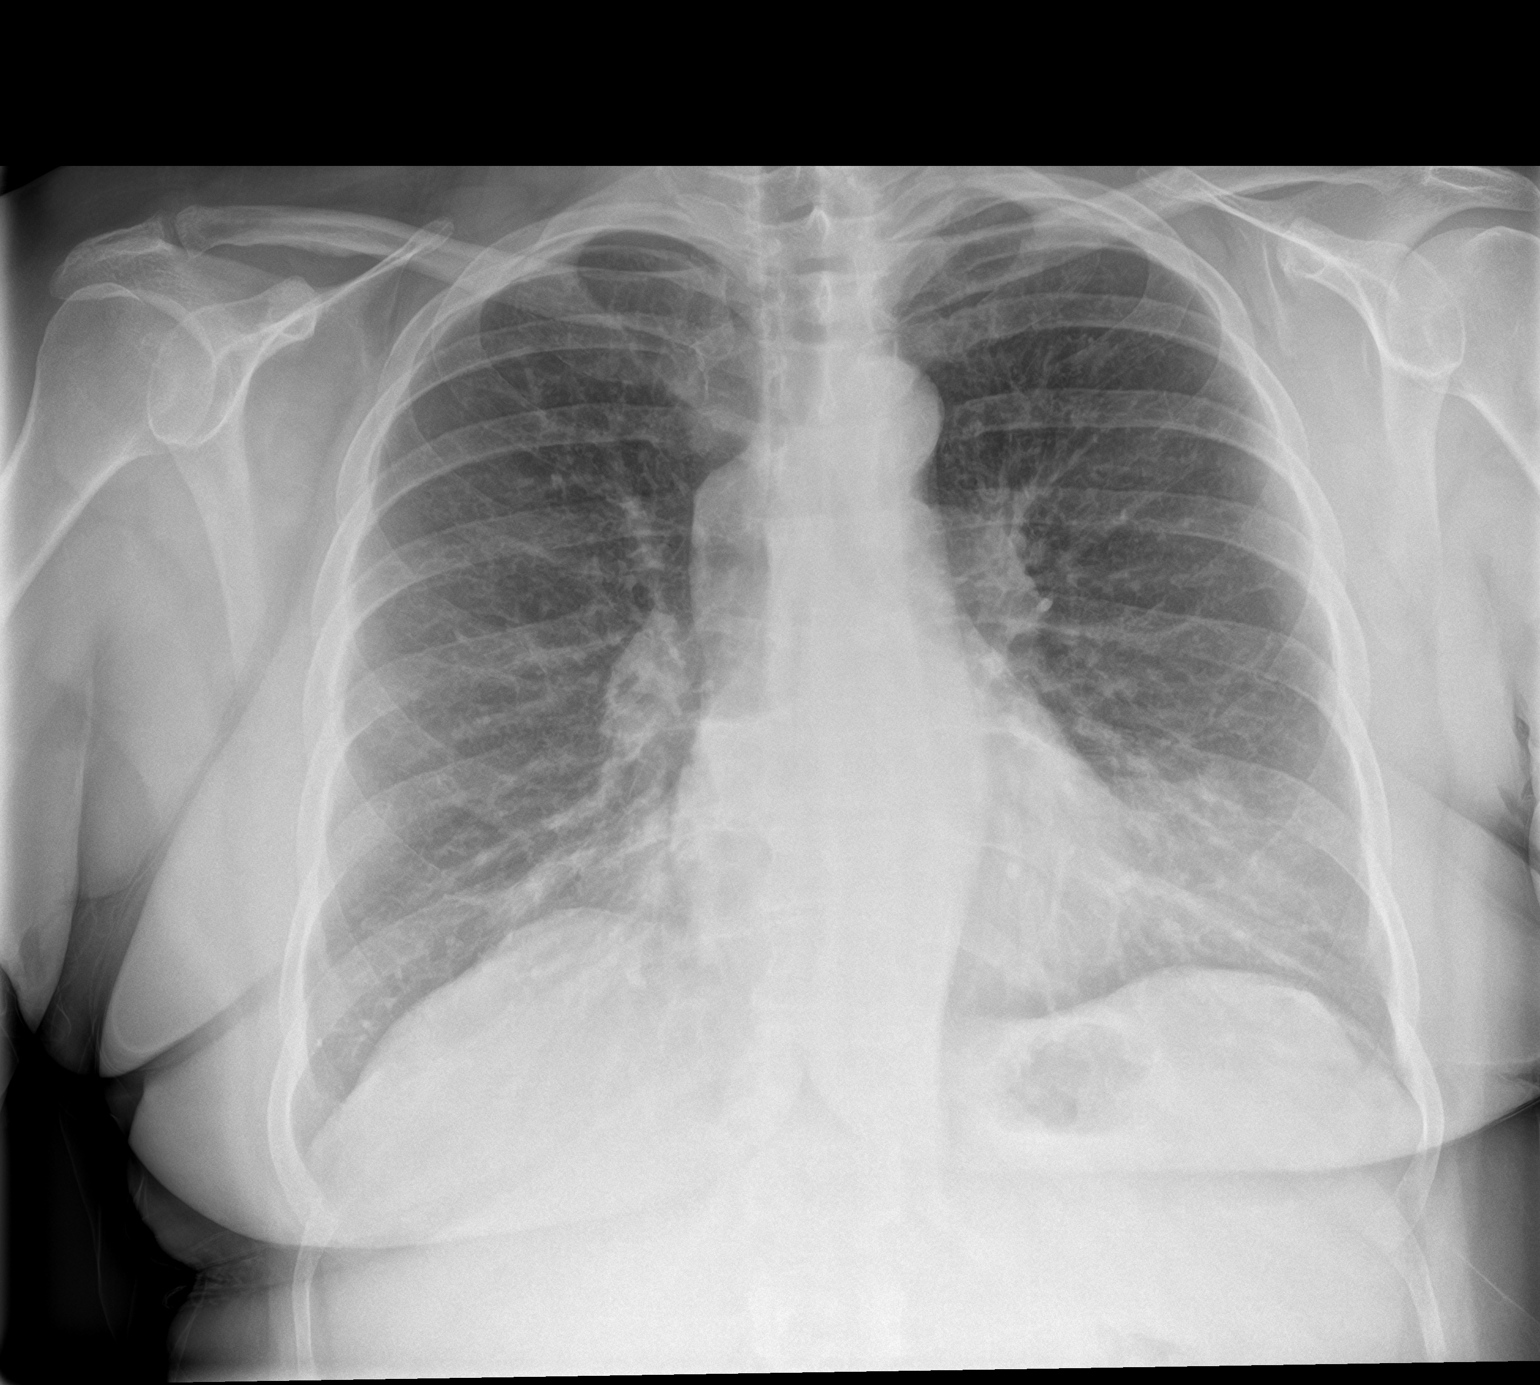

[chest lat]
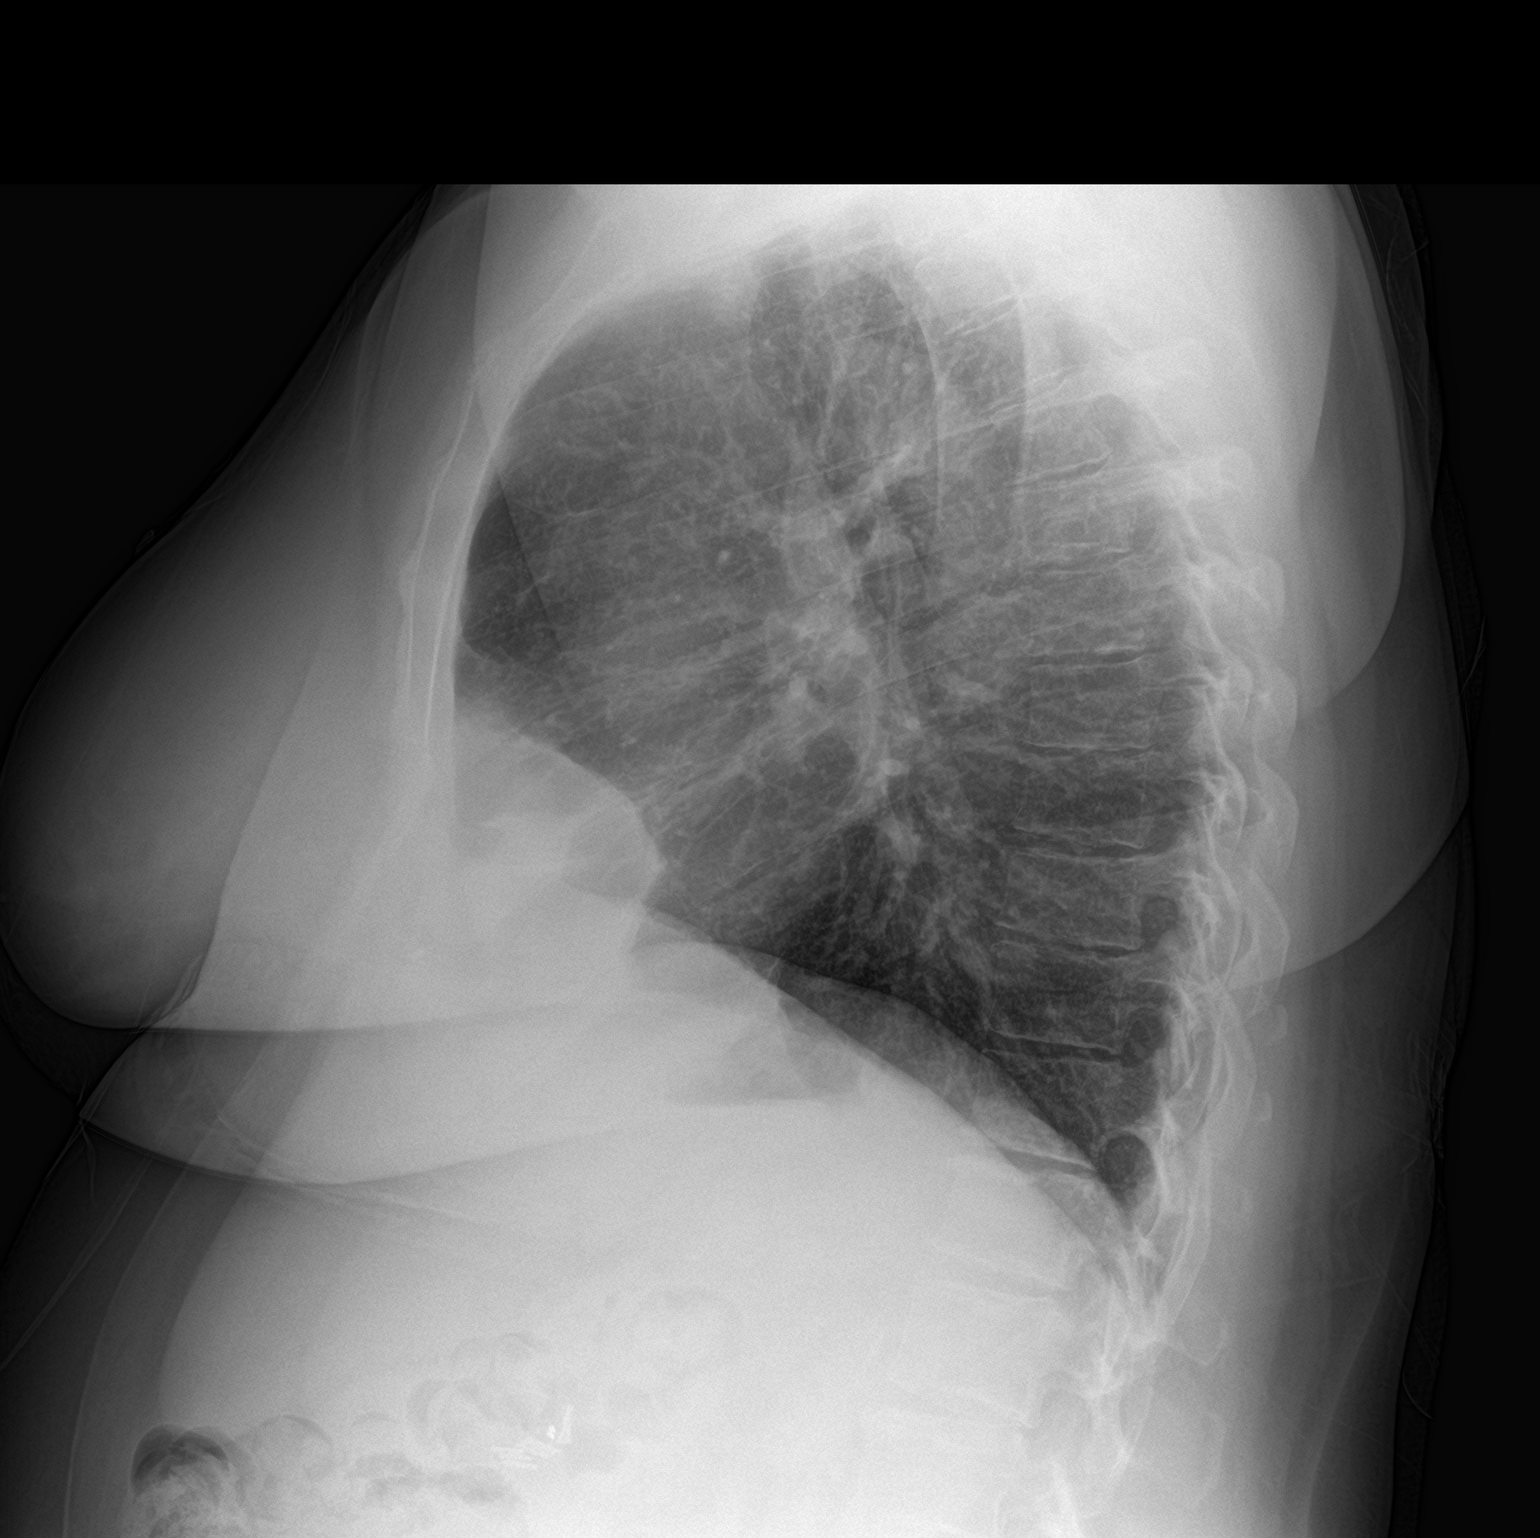

[2 of 2 positions shown; findings below may reference images not displayed]

FINDINGS: The heart, hila, and mediastinum are normal. No pneumothorax. Mild
opacity in the medial right lung base. Hazy opacity in left lung
base. No nodules or masses. No other acute abnormalities.
IMPRESSION: Hazy opacity in the left base may represent infiltrate/pneumonia.
Mild opacity in the medial right lung base may represent
atelectasis, vascular crowding, or less likely infiltrate. Recommend
short-term follow-up imaging to ensure resolution of these findings.

## 2022-04-12 ENCOUNTER — Ambulatory Visit: Payer: Self-pay | Admitting: Physician Assistant

## 2022-04-19 DIAGNOSIS — N3941 Urge incontinence: Secondary | ICD-10-CM | POA: Diagnosis not present

## 2022-04-19 DIAGNOSIS — R799 Abnormal finding of blood chemistry, unspecified: Secondary | ICD-10-CM | POA: Diagnosis not present

## 2022-04-19 DIAGNOSIS — M25561 Pain in right knee: Secondary | ICD-10-CM | POA: Diagnosis not present

## 2022-04-19 DIAGNOSIS — R0602 Shortness of breath: Secondary | ICD-10-CM | POA: Diagnosis not present

## 2022-04-19 DIAGNOSIS — M25562 Pain in left knee: Secondary | ICD-10-CM | POA: Diagnosis not present

## 2022-04-19 DIAGNOSIS — G629 Polyneuropathy, unspecified: Secondary | ICD-10-CM | POA: Diagnosis not present

## 2022-04-19 DIAGNOSIS — M5417 Radiculopathy, lumbosacral region: Secondary | ICD-10-CM | POA: Diagnosis not present

## 2022-04-19 DIAGNOSIS — Z885 Allergy status to narcotic agent status: Secondary | ICD-10-CM | POA: Diagnosis not present

## 2022-04-19 DIAGNOSIS — F17210 Nicotine dependence, cigarettes, uncomplicated: Secondary | ICD-10-CM | POA: Diagnosis not present

## 2022-06-06 ENCOUNTER — Other Ambulatory Visit: Payer: Self-pay | Admitting: Physician Assistant

## 2022-06-07 DIAGNOSIS — M5136 Other intervertebral disc degeneration, lumbar region: Secondary | ICD-10-CM | POA: Diagnosis not present

## 2022-06-07 DIAGNOSIS — M5417 Radiculopathy, lumbosacral region: Secondary | ICD-10-CM | POA: Diagnosis not present

## 2022-06-07 DIAGNOSIS — M48061 Spinal stenosis, lumbar region without neurogenic claudication: Secondary | ICD-10-CM | POA: Diagnosis not present

## 2022-06-07 DIAGNOSIS — M4316 Spondylolisthesis, lumbar region: Secondary | ICD-10-CM | POA: Diagnosis not present

## 2022-06-07 DIAGNOSIS — M545 Low back pain, unspecified: Secondary | ICD-10-CM | POA: Diagnosis not present

## 2022-06-14 DIAGNOSIS — G629 Polyneuropathy, unspecified: Secondary | ICD-10-CM | POA: Diagnosis not present

## 2022-07-01 DIAGNOSIS — G629 Polyneuropathy, unspecified: Secondary | ICD-10-CM | POA: Diagnosis not present

## 2022-07-27 DIAGNOSIS — H5203 Hypermetropia, bilateral: Secondary | ICD-10-CM | POA: Diagnosis not present

## 2022-07-27 DIAGNOSIS — Z0100 Encounter for examination of eyes and vision without abnormal findings: Secondary | ICD-10-CM | POA: Diagnosis not present

## 2022-07-27 DIAGNOSIS — R6889 Other general symptoms and signs: Secondary | ICD-10-CM | POA: Diagnosis not present

## 2022-08-04 DIAGNOSIS — E785 Hyperlipidemia, unspecified: Secondary | ICD-10-CM | POA: Diagnosis not present

## 2022-08-04 DIAGNOSIS — F17210 Nicotine dependence, cigarettes, uncomplicated: Secondary | ICD-10-CM | POA: Diagnosis not present

## 2022-08-04 DIAGNOSIS — K219 Gastro-esophageal reflux disease without esophagitis: Secondary | ICD-10-CM | POA: Diagnosis not present

## 2022-08-04 DIAGNOSIS — R6889 Other general symptoms and signs: Secondary | ICD-10-CM | POA: Diagnosis not present

## 2022-08-04 DIAGNOSIS — J45909 Unspecified asthma, uncomplicated: Secondary | ICD-10-CM | POA: Diagnosis not present

## 2022-08-04 DIAGNOSIS — M130 Polyarthritis, unspecified: Secondary | ICD-10-CM | POA: Diagnosis not present

## 2022-08-12 DIAGNOSIS — R6889 Other general symptoms and signs: Secondary | ICD-10-CM | POA: Diagnosis not present

## 2022-08-12 DIAGNOSIS — G2582 Stiff-man syndrome: Secondary | ICD-10-CM | POA: Diagnosis not present

## 2022-08-12 DIAGNOSIS — R252 Cramp and spasm: Secondary | ICD-10-CM | POA: Diagnosis not present

## 2022-08-12 DIAGNOSIS — G603 Idiopathic progressive neuropathy: Secondary | ICD-10-CM | POA: Diagnosis not present

## 2022-08-12 DIAGNOSIS — M79606 Pain in leg, unspecified: Secondary | ICD-10-CM | POA: Diagnosis not present

## 2022-09-23 DIAGNOSIS — M79606 Pain in leg, unspecified: Secondary | ICD-10-CM | POA: Diagnosis not present

## 2022-09-23 DIAGNOSIS — G603 Idiopathic progressive neuropathy: Secondary | ICD-10-CM | POA: Diagnosis not present

## 2022-09-23 DIAGNOSIS — R252 Cramp and spasm: Secondary | ICD-10-CM | POA: Diagnosis not present

## 2022-09-23 DIAGNOSIS — G2582 Stiff-man syndrome: Secondary | ICD-10-CM | POA: Diagnosis not present

## 2022-09-23 DIAGNOSIS — R6889 Other general symptoms and signs: Secondary | ICD-10-CM | POA: Diagnosis not present

## 2022-10-21 DIAGNOSIS — R6889 Other general symptoms and signs: Secondary | ICD-10-CM | POA: Diagnosis not present

## 2022-10-21 DIAGNOSIS — R252 Cramp and spasm: Secondary | ICD-10-CM | POA: Diagnosis not present

## 2022-10-21 DIAGNOSIS — M79606 Pain in leg, unspecified: Secondary | ICD-10-CM | POA: Diagnosis not present

## 2022-10-21 DIAGNOSIS — G603 Idiopathic progressive neuropathy: Secondary | ICD-10-CM | POA: Diagnosis not present

## 2022-10-21 DIAGNOSIS — G2582 Stiff-man syndrome: Secondary | ICD-10-CM | POA: Diagnosis not present

## 2022-11-05 DIAGNOSIS — M130 Polyarthritis, unspecified: Secondary | ICD-10-CM | POA: Diagnosis not present

## 2022-11-05 DIAGNOSIS — R6889 Other general symptoms and signs: Secondary | ICD-10-CM | POA: Diagnosis not present

## 2022-11-05 DIAGNOSIS — J45909 Unspecified asthma, uncomplicated: Secondary | ICD-10-CM | POA: Diagnosis not present

## 2022-11-05 DIAGNOSIS — F3340 Major depressive disorder, recurrent, in remission, unspecified: Secondary | ICD-10-CM | POA: Diagnosis not present

## 2022-11-05 DIAGNOSIS — R000 Tachycardia, unspecified: Secondary | ICD-10-CM | POA: Diagnosis not present

## 2022-11-05 DIAGNOSIS — K219 Gastro-esophageal reflux disease without esophagitis: Secondary | ICD-10-CM | POA: Diagnosis not present

## 2022-11-08 ENCOUNTER — Ambulatory Visit (HOSPITAL_COMMUNITY): Payer: Medicare HMO | Attending: Gerontology

## 2023-03-08 ENCOUNTER — Encounter: Payer: Self-pay | Admitting: Diagnostic Neuroimaging

## 2023-03-08 ENCOUNTER — Ambulatory Visit (INDEPENDENT_AMBULATORY_CARE_PROVIDER_SITE_OTHER): Payer: Medicare HMO | Admitting: Diagnostic Neuroimaging

## 2023-03-08 VITALS — BP 112/76 | HR 106 | Ht 67.0 in | Wt 206.0 lb

## 2023-03-08 DIAGNOSIS — M5441 Lumbago with sciatica, right side: Secondary | ICD-10-CM

## 2023-03-08 DIAGNOSIS — R413 Other amnesia: Secondary | ICD-10-CM | POA: Diagnosis not present

## 2023-03-08 DIAGNOSIS — R200 Anesthesia of skin: Secondary | ICD-10-CM | POA: Diagnosis not present

## 2023-03-08 DIAGNOSIS — M5442 Lumbago with sciatica, left side: Secondary | ICD-10-CM

## 2023-03-08 DIAGNOSIS — M79604 Pain in right leg: Secondary | ICD-10-CM

## 2023-03-08 DIAGNOSIS — R6889 Other general symptoms and signs: Secondary | ICD-10-CM | POA: Diagnosis not present

## 2023-03-08 DIAGNOSIS — M79605 Pain in left leg: Secondary | ICD-10-CM | POA: Diagnosis not present

## 2023-03-08 DIAGNOSIS — M542 Cervicalgia: Secondary | ICD-10-CM | POA: Diagnosis not present

## 2023-03-08 NOTE — Progress Notes (Signed)
[facility]  PATIENT: Sierra Deleon DOB: March 14, 1961  REFERRING CLINICIAN: Barton Fanny, NP HISTORY FROM: patient  REASON FOR VISIT: new consult   HISTORICAL  CHIEF COMPLAINT:  Chief Complaint  Patient presents with   New Patient (Initial Visit)    Patient in room #7 and alone. Patient states she has pain and spasm in both legs.    HISTORY OF PRESENT ILLNESS:   62 year old female here for evaluation of neuropathy.  Patient has had numbness, pain, tingling and feet, legs, hands since at least 2014.  This is progressed over time.  Eventually she saw neurologist at [facility], had extensive testing, was diagnosed with small fiber neuropathy based on skin biopsy.  No specific cause was found.  She also had some cervical and lumbar spinal stenosis, referred to neurosurgery, managed conservatively.  She went to another neurologist for pain management and was treated with gabapentin and duloxetine.  Patient also mentions some chronic issues related to memory loss, anxiety, insomnia, hallucinations, visual distortions.   REVIEW OF SYSTEMS: Full 14 system review of systems performed and negative with exception of: as per HPI.  ALLERGIES: Allergies  Allergen Reactions   Codeine Shortness Of Breath    HOME MEDICATIONS: Outpatient Medications Prior to Visit  Medication Sig Dispense Refill   anti-nausea (EMETROL) solution Take 10 mLs by mouth every 15 (fifteen) minutes as needed for nausea or vomiting.     atorvastatin (LIPITOR) 20 MG tablet Take 1 tablet (20 mg total) by mouth daily. 90 tablet 1   cloNIDine (CATAPRES) 0.1 MG tablet Take 0.1 mg by mouth daily.     diphenhydrAMINE (BENADRYL) 25 mg capsule Take 25 mg by mouth every 6 (six) hours as needed.     DULoxetine (CYMBALTA) 30 MG capsule Take 30 mg by mouth 2 (two) times daily.     gabapentin (NEURONTIN) 600 MG tablet Take 600 mg by mouth 3 (three) times daily.     omeprazole (PRILOSEC) 40 MG capsule Take 1  capsule (40 mg total) by mouth in the morning and at bedtime. 180 capsule 0   Acetamin Chew Tab & Susp 160 & 160 MG &MG/5ML THPK Take by mouth. (Patient not taking: Reported on 03/08/2023)     albuterol (VENTOLIN HFA) 108 (90 Base) MCG/ACT inhaler Inhale 2 puffs into the lungs every 4 (four) hours as needed.     cyclobenzaprine (FLEXERIL) 10 MG tablet Take 10 mg by mouth at bedtime.     gabapentin (NEURONTIN) 300 MG capsule Take 1 capsule (300 mg total) by mouth 3 (three) times daily. (Patient not taking: Reported on 03/08/2023) 90 capsule 0   No facility-administered medications prior to visit.    PAST MEDICAL HISTORY: Past Medical History:  Diagnosis Date   Asthma    Sciatica     PAST SURGICAL HISTORY: Past Surgical History:  Procedure Laterality Date   APPENDECTOMY     CHOLECYSTECTOMY     TONSILLECTOMY      FAMILY HISTORY: Family History  Problem Relation Age of Onset   Heart disease Father    Breast cancer Paternal Grandmother     SOCIAL HISTORY: Social History   Socioeconomic History   Marital status: Married    Spouse name: Not on file   Number of children: Not on file   Years of education: Not on file   Highest education level: Not on file  Occupational History   Not on file  Tobacco Use   Smoking status: Former    Packs/day: .  5    Types: Cigarettes    Quit date: 08/20/2021    Years since quitting: 1.5   Smokeless tobacco: Never  Vaping Use   Vaping Use: Never used  Substance and Sexual Activity   Alcohol use: Not Currently   Drug use: Never   Sexual activity: Not on file  Other Topics Concern   Not on file  Social History Narrative   Not on file   Social Determinants of Health   Financial Resource Strain: Not on file  Food Insecurity: Not on file  Transportation Needs: Not on file  Physical Activity: Not on file  Stress: Not on file  Social Connections: Not on file  Intimate Partner Violence: Not on file     PHYSICAL EXAM  GENERAL  EXAM/CONSTITUTIONAL: Vitals:  Vitals:   03/08/23 0932  BP: 112/76  Pulse: (!) 106  Weight: 206 lb (93.4 kg)  Height: 5\' 7"  (1.702 m)   Body mass index is 32.26 kg/m. Wt Readings from Last 3 Encounters:  03/08/23 206 lb (93.4 kg)  02/17/22 202 lb (91.6 kg)  12/23/21 194 lb (88 kg)   Patient is in no distress; well developed, nourished and groomed; neck is supple  CARDIOVASCULAR: Examination of carotid arteries is normal; no carotid bruits Regular rate and rhythm, no murmurs Examination of peripheral vascular system by observation and palpation is normal  EYES: Ophthalmoscopic exam of optic discs and posterior segments is normal; no papilledema or hemorrhages No results found.  MUSCULOSKELETAL: Gait, strength, tone, movements noted in Neurologic exam below  NEUROLOGIC: MENTAL STATUS:      No data to display         awake, alert, oriented to person, place and time recent and remote memory intact normal attention and concentration language fluent, comprehension intact, naming intact fund of knowledge appropriate  CRANIAL NERVE:  2nd - no papilledema on fundoscopic exam 2nd, 3rd, 4th, 6th - pupils equal and reactive to light, visual fields full to confrontation, extraocular muscles intact, no nystagmus 5th - facial sensation symmetric 7th - facial strength symmetric 8th - hearing intact 9th - palate elevates symmetrically, uvula midline 11th - shoulder shrug symmetric 12th - tongue protrusion midline  MOTOR:  normal bulk and tone, full strength in the BUE, BLE; EXCEPT RIGHT FOOT DF 4/5  SENSORY:  normal and symmetric to light touch, temperature, vibration  COORDINATION:  finger-nose-finger, fine finger movements normal  REFLEXES:  deep tendon reflexes TRACE and symmetric  GAIT/STATION:  narrow based gait     DIAGNOSTIC DATA (LABS, IMAGING, TESTING) - I reviewed patient records, labs, notes, testing and imaging myself where available.  Lab Results   Component Value Date   WBC 19.4 (H) 01/09/2021   HGB 14.7 01/09/2021   HCT 44.4 01/09/2021   MCV 89.2 01/09/2021   PLT 367 01/09/2021      Component Value Date/Time   NA 138 09/14/2021 0849   K 4.8 09/14/2021 0849   CL 104 09/14/2021 0849   CO2 25 09/14/2021 0849   GLUCOSE 107 (H) 09/14/2021 0849   BUN 13 09/14/2021 0849   CREATININE 0.87 09/14/2021 0849   CALCIUM 9.2 09/14/2021 0849   PROT 7.3 01/11/2022 0909   ALBUMIN 4.2 01/11/2022 0909   AST 14 (L) 01/11/2022 0909   ALT 21 01/11/2022 0909   ALKPHOS 79 01/11/2022 0909   BILITOT 0.3 01/11/2022 0909   GFRNONAA >60 09/14/2021 0849   Lab Results  Component Value Date   CHOL 178 01/11/2022   HDL  44 01/11/2022   LDLCALC 112 (H) 01/11/2022   TRIG 109 01/11/2022   CHOLHDL 4.0 01/11/2022   Lab Results  Component Value Date   HGBA1C 5.4 09/14/2021   No results found for: "VITAMINB12" No results found for: "TSH"  Component Ref Range & Units 10 mo ago  Hemoglobin A1C 4.8 - 5.6 % 5.3   Component Ref Range & Units 10 mo ago  Vitamin B-12 211 - 911 pg/ml 430   Component Ref Range & Units 10 mo ago Comments  GAD65 Ab, Serum <=0.02 nmol/L 0.00    Component Ref Range & Units 10 mo ago Comments  Lyme Ab (Serology) Negative Negative    Immunofixation Electrophoresis, Serum  No monoclonal protein detected.   04/19/22 EMG/NCS - This is a normal study except for absent right H reflex which may  be secondary to old right sided S1 radiculopathy although may  also be technical in nature.   04/19/22 labs  paraneoplastic panel - negative  Myositis panel - negative  Genetic Neuropathy panel  - negative  10/16/21 MRI lumbar spine [I reviewed images myself and agree with interpretation. -VRP]  1. Degenerative changes of the lumbar spine with moderate spinal canal stenosis at L3-4 and mild at L4-5. 2. Left central/subarticular superiorly migrating disc extrusion at L1-2 causing narrowing of the left subarticular  zone and mild left neural foraminal narrowing.  02/11/22 MRI brain - Scattered foci of FLAIR hyperintensity within the supratentorial periventricular and subcortical white matter as above. The pattern of distribution is nonspecific and can be associated with chronic microvascular ischemic changes, vasculitis or demyelinating process.   02/11/22 MRI cervical spine  1. Multilevel cervical spondylosis with severe foraminal narrowing involving the right C4-5, bilateral C5-6, and left C6-7 levels.  2. Moderate spinal canal narrowing involving C4-5   02/11/22 MRI thoracic spine -Multilevel degenerative changes.  -Posterior disc protrusion at multiple levels causing mild spinal canal narrowing at T4-T5, T5-T6, T9-T10, and T11-T12.  -Synovial cysts at multiple levels.  -No significant neuroforaminal narrowing.  -T2 hyperintense spinal cord signal, favored to reflect artifact as it is not appreciable on other sequences.   06/07/22 MRI lumbar spine 1. Mild multilevel degenerative changes of the lumbar spine with  mild canal stenosis at L3-L4 and L4-L5. No high-grade foraminal or  canal stenosis at any level.  2. Shallow left paracentral disc protrusion at L1-L2. Previously  seen disc extrusion at this level has receded.     ASSESSMENT AND PLAN  62 y.o. year old female here with:   Dx:  1. Numbness   2. Memory loss   3. Pain in both lower extremities   4. Neck pain   5. 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    Penni Bombard, MD [id number], [id number] AM Certified in Neurology, Neurophysiology and Neuroimaging  [facility]  [address] [place], [place] [address] [phone]

## 2023-03-09 ENCOUNTER — Telehealth: Payer: Self-pay | Admitting: Diagnostic Neuroimaging

## 2023-03-09 NOTE — Telephone Encounter (Signed)
Referral for pain clinic fax to [facility]. Phone: [phone], Fax: [phone]. 

## 2023-03-09 NOTE — Telephone Encounter (Signed)
Craig Staggers: [id number] exp. 03/09/23-04/08/23 sent to GI

## 2023-03-11 LAB — ANA,IFA RA DIAG PNL W/RFLX TIT/PATN
ANA Titer 1: NEGATIVE
Cyclic Citrullin Peptide Ab: 6 units (ref 0–19)
Rheumatoid fact SerPl-aCnc: 10.7 IU/mL (ref <14.0)

## 2023-03-11 LAB — SJOGREN'S SYNDROME ANTIBODS(SSA + SSB)
ENA SSA (RO) Ab: <0.2 AI (ref 0.0–0.9)
ENA SSB (LA) Ab: <0.2 AI (ref 0.0–0.9)

## 2023-03-21 DIAGNOSIS — M5416 Radiculopathy, lumbar region: Secondary | ICD-10-CM | POA: Diagnosis not present

## 2023-03-21 DIAGNOSIS — R6889 Other general symptoms and signs: Secondary | ICD-10-CM | POA: Diagnosis not present

## 2023-03-21 DIAGNOSIS — G6289 Other specified polyneuropathies: Secondary | ICD-10-CM | POA: Diagnosis not present

## 2023-03-21 DIAGNOSIS — M4802 Spinal stenosis, cervical region: Secondary | ICD-10-CM | POA: Diagnosis not present

## 2023-03-21 DIAGNOSIS — Z6832 Body mass index (BMI) 32.0-32.9, adult: Secondary | ICD-10-CM | POA: Diagnosis not present

## 2023-03-29 DIAGNOSIS — R32 Unspecified urinary incontinence: Secondary | ICD-10-CM | POA: Diagnosis not present

## 2023-03-29 DIAGNOSIS — Z1331 Encounter for screening for depression: Secondary | ICD-10-CM | POA: Diagnosis not present

## 2023-03-29 DIAGNOSIS — M48061 Spinal stenosis, lumbar region without neurogenic claudication: Secondary | ICD-10-CM | POA: Diagnosis not present

## 2023-03-29 DIAGNOSIS — F17210 Nicotine dependence, cigarettes, uncomplicated: Secondary | ICD-10-CM | POA: Diagnosis not present

## 2023-03-29 DIAGNOSIS — F331 Major depressive disorder, recurrent, moderate: Secondary | ICD-10-CM | POA: Diagnosis not present

## 2023-03-29 DIAGNOSIS — R296 Repeated falls: Secondary | ICD-10-CM | POA: Diagnosis not present

## 2023-03-29 DIAGNOSIS — M5136 Other intervertebral disc degeneration, lumbar region: Secondary | ICD-10-CM | POA: Diagnosis not present

## 2023-03-29 DIAGNOSIS — Z0001 Encounter for general adult medical examination with abnormal findings: Secondary | ICD-10-CM | POA: Diagnosis not present

## 2023-03-29 DIAGNOSIS — Z1389 Encounter for screening for other disorder: Secondary | ICD-10-CM | POA: Diagnosis not present

## 2023-03-29 DIAGNOSIS — R6889 Other general symptoms and signs: Secondary | ICD-10-CM | POA: Diagnosis not present

## 2023-03-30 DIAGNOSIS — R6889 Other general symptoms and signs: Secondary | ICD-10-CM | POA: Diagnosis not present

## 2023-03-30 DIAGNOSIS — M5416 Radiculopathy, lumbar region: Secondary | ICD-10-CM | POA: Diagnosis not present

## 2023-03-31 ENCOUNTER — Other Ambulatory Visit: Payer: Medicare HMO

## 2023-04-13 ENCOUNTER — Ambulatory Visit
Admission: RE | Admit: 2023-04-13 | Discharge: 2023-04-13 | Disposition: A | Discharge status: Home / Self Care | Payer: Medicare HMO | Source: Ambulatory Visit | Attending: Diagnostic Neuroimaging | Admitting: Diagnostic Neuroimaging

## 2023-04-13 DIAGNOSIS — R413 Other amnesia: Secondary | ICD-10-CM

## 2023-04-13 DIAGNOSIS — R200 Anesthesia of skin: Secondary | ICD-10-CM

## 2023-04-13 DIAGNOSIS — R6889 Other general symptoms and signs: Secondary | ICD-10-CM | POA: Diagnosis not present

## 2023-04-13 MED ORDER — GADOPICLENOL 0.5 MMOL/ML IV SOLN
10.0000 mL | Freq: Once | INTRAVENOUS | Status: AC | PRN
  Administered 2023-04-13: 10 mL via INTRAVENOUS

## 2023-04-25 DIAGNOSIS — M5416 Radiculopathy, lumbar region: Secondary | ICD-10-CM | POA: Diagnosis not present

## 2023-04-25 DIAGNOSIS — R6889 Other general symptoms and signs: Secondary | ICD-10-CM | POA: Diagnosis not present

## 2023-04-28 DIAGNOSIS — J45909 Unspecified asthma, uncomplicated: Secondary | ICD-10-CM | POA: Diagnosis not present

## 2023-04-28 DIAGNOSIS — E785 Hyperlipidemia, unspecified: Secondary | ICD-10-CM | POA: Diagnosis not present

## 2023-05-19 DIAGNOSIS — G6289 Other specified polyneuropathies: Secondary | ICD-10-CM | POA: Diagnosis not present

## 2023-05-19 DIAGNOSIS — Z6832 Body mass index (BMI) 32.0-32.9, adult: Secondary | ICD-10-CM | POA: Diagnosis not present

## 2023-05-19 DIAGNOSIS — R6889 Other general symptoms and signs: Secondary | ICD-10-CM | POA: Diagnosis not present

## 2023-05-26 ENCOUNTER — Other Ambulatory Visit: Payer: Self-pay | Admitting: Physician Assistant

## 2023-05-26 DIAGNOSIS — G6289 Other specified polyneuropathies: Secondary | ICD-10-CM

## 2023-05-29 DIAGNOSIS — E785 Hyperlipidemia, unspecified: Secondary | ICD-10-CM | POA: Diagnosis not present

## 2023-05-29 DIAGNOSIS — J45909 Unspecified asthma, uncomplicated: Secondary | ICD-10-CM | POA: Diagnosis not present

## 2023-06-02 DIAGNOSIS — F411 Generalized anxiety disorder: Secondary | ICD-10-CM | POA: Diagnosis not present

## 2023-06-03 ENCOUNTER — Ambulatory Visit
Admission: RE | Admit: 2023-06-03 | Discharge: 2023-06-03 | Disposition: A | Discharge status: Home / Self Care | Payer: Medicare HMO | Source: Ambulatory Visit | Attending: Physician Assistant | Admitting: Physician Assistant

## 2023-06-03 DIAGNOSIS — G6289 Other specified polyneuropathies: Secondary | ICD-10-CM

## 2023-06-03 DIAGNOSIS — M5124 Other intervertebral disc displacement, thoracic region: Secondary | ICD-10-CM | POA: Diagnosis not present

## 2023-06-03 DIAGNOSIS — R6889 Other general symptoms and signs: Secondary | ICD-10-CM | POA: Diagnosis not present

## 2023-06-28 DIAGNOSIS — J45909 Unspecified asthma, uncomplicated: Secondary | ICD-10-CM | POA: Diagnosis not present

## 2023-06-28 DIAGNOSIS — E785 Hyperlipidemia, unspecified: Secondary | ICD-10-CM | POA: Diagnosis not present

## 2023-07-18 ENCOUNTER — Encounter: Payer: Self-pay | Admitting: Diagnostic Neuroimaging

## 2023-07-29 DIAGNOSIS — E785 Hyperlipidemia, unspecified: Secondary | ICD-10-CM | POA: Diagnosis not present

## 2023-07-29 DIAGNOSIS — J45909 Unspecified asthma, uncomplicated: Secondary | ICD-10-CM | POA: Diagnosis not present

## 2023-08-02 NOTE — Telephone Encounter (Signed)
I called patient to review.  Patient has not gone over these test results with ordering physician Dr Rachael Darby at [facility] neurology.  I encouraged her to reach out to the clinic to review further.  I reviewed CMT 4 symptoms and management, which is mainly conservative and pain management.  Patient is already established with pain management clinic.  Suanne Marker, MD 08/02/2023, 4:16 PM Certified in Neurology, Neurophysiology and Neuroimaging  [facility] [address], [address] [place], [place] [address] [phone]

## 2023-08-10 DIAGNOSIS — G6289 Other specified polyneuropathies: Secondary | ICD-10-CM | POA: Diagnosis not present

## 2023-08-10 DIAGNOSIS — M961 Postlaminectomy syndrome, not elsewhere classified: Secondary | ICD-10-CM | POA: Diagnosis not present

## 2023-08-10 DIAGNOSIS — R6889 Other general symptoms and signs: Secondary | ICD-10-CM | POA: Diagnosis not present

## 2023-08-10 DIAGNOSIS — Z6832 Body mass index (BMI) 32.0-32.9, adult: Secondary | ICD-10-CM | POA: Diagnosis not present

## 2023-08-10 DIAGNOSIS — M5416 Radiculopathy, lumbar region: Secondary | ICD-10-CM | POA: Diagnosis not present

## 2023-08-29 DIAGNOSIS — E785 Hyperlipidemia, unspecified: Secondary | ICD-10-CM | POA: Diagnosis not present

## 2023-08-29 DIAGNOSIS — J45909 Unspecified asthma, uncomplicated: Secondary | ICD-10-CM | POA: Diagnosis not present

## 2023-09-28 DIAGNOSIS — J45909 Unspecified asthma, uncomplicated: Secondary | ICD-10-CM | POA: Diagnosis not present

## 2023-09-28 DIAGNOSIS — E785 Hyperlipidemia, unspecified: Secondary | ICD-10-CM | POA: Diagnosis not present

## 2023-10-11 DIAGNOSIS — M5416 Radiculopathy, lumbar region: Secondary | ICD-10-CM | POA: Diagnosis not present

## 2023-10-11 DIAGNOSIS — G6289 Other specified polyneuropathies: Secondary | ICD-10-CM | POA: Diagnosis not present

## 2023-10-11 DIAGNOSIS — R6889 Other general symptoms and signs: Secondary | ICD-10-CM | POA: Diagnosis not present

## 2023-10-17 DIAGNOSIS — R6889 Other general symptoms and signs: Secondary | ICD-10-CM | POA: Diagnosis not present

## 2023-11-02 DIAGNOSIS — G894 Chronic pain syndrome: Secondary | ICD-10-CM | POA: Diagnosis not present

## 2023-11-02 DIAGNOSIS — F331 Major depressive disorder, recurrent, moderate: Secondary | ICD-10-CM | POA: Diagnosis not present

## 2023-11-02 DIAGNOSIS — K219 Gastro-esophageal reflux disease without esophagitis: Secondary | ICD-10-CM | POA: Diagnosis not present

## 2023-11-02 DIAGNOSIS — Z993 Dependence on wheelchair: Secondary | ICD-10-CM | POA: Diagnosis not present

## 2023-11-11 DIAGNOSIS — M5386 Other specified dorsopathies, lumbar region: Secondary | ICD-10-CM | POA: Diagnosis not present

## 2023-11-11 DIAGNOSIS — G6289 Other specified polyneuropathies: Secondary | ICD-10-CM | POA: Diagnosis not present

## 2023-11-11 DIAGNOSIS — M961 Postlaminectomy syndrome, not elsewhere classified: Secondary | ICD-10-CM | POA: Diagnosis not present

## 2023-11-11 DIAGNOSIS — M5416 Radiculopathy, lumbar region: Secondary | ICD-10-CM | POA: Diagnosis not present

## 2023-11-21 DIAGNOSIS — R6889 Other general symptoms and signs: Secondary | ICD-10-CM | POA: Diagnosis not present

## 2023-12-02 DIAGNOSIS — J45909 Unspecified asthma, uncomplicated: Secondary | ICD-10-CM | POA: Diagnosis not present

## 2023-12-02 DIAGNOSIS — E785 Hyperlipidemia, unspecified: Secondary | ICD-10-CM | POA: Diagnosis not present

## 2024-01-02 DIAGNOSIS — J45909 Unspecified asthma, uncomplicated: Secondary | ICD-10-CM | POA: Diagnosis not present

## 2024-01-02 DIAGNOSIS — E785 Hyperlipidemia, unspecified: Secondary | ICD-10-CM | POA: Diagnosis not present

## 2024-01-04 DIAGNOSIS — M961 Postlaminectomy syndrome, not elsewhere classified: Secondary | ICD-10-CM | POA: Diagnosis not present

## 2024-01-04 DIAGNOSIS — G6289 Other specified polyneuropathies: Secondary | ICD-10-CM | POA: Diagnosis not present

## 2024-01-04 DIAGNOSIS — M5416 Radiculopathy, lumbar region: Secondary | ICD-10-CM | POA: Diagnosis not present

## 2024-01-04 DIAGNOSIS — Z9689 Presence of other specified functional implants: Secondary | ICD-10-CM | POA: Diagnosis not present

## 2024-02-02 DIAGNOSIS — E785 Hyperlipidemia, unspecified: Secondary | ICD-10-CM | POA: Diagnosis not present

## 2024-02-02 DIAGNOSIS — J45909 Unspecified asthma, uncomplicated: Secondary | ICD-10-CM | POA: Diagnosis not present

## 2024-03-01 DIAGNOSIS — E785 Hyperlipidemia, unspecified: Secondary | ICD-10-CM | POA: Diagnosis not present

## 2024-03-01 DIAGNOSIS — J45909 Unspecified asthma, uncomplicated: Secondary | ICD-10-CM | POA: Diagnosis not present

## 2024-03-28 ENCOUNTER — Ambulatory Visit: Payer: Self-pay

## 2024-03-28 VITALS — BP 128/82 | HR 118 | Ht 67.0 in | Wt 200.4 lb

## 2024-03-28 DIAGNOSIS — M4802 Spinal stenosis, cervical region: Secondary | ICD-10-CM | POA: Insufficient documentation

## 2024-03-28 DIAGNOSIS — Z9889 Other specified postprocedural states: Secondary | ICD-10-CM | POA: Insufficient documentation

## 2024-03-28 DIAGNOSIS — M961 Postlaminectomy syndrome, not elsewhere classified: Secondary | ICD-10-CM | POA: Insufficient documentation

## 2024-03-28 DIAGNOSIS — G43709 Chronic migraine without aura, not intractable, without status migrainosus: Secondary | ICD-10-CM

## 2024-03-28 DIAGNOSIS — M5416 Radiculopathy, lumbar region: Secondary | ICD-10-CM | POA: Insufficient documentation

## 2024-03-28 DIAGNOSIS — G629 Polyneuropathy, unspecified: Secondary | ICD-10-CM | POA: Insufficient documentation

## 2024-03-28 MED ORDER — SUMATRIPTAN SUCCINATE 25 MG PO TABS
25.0000 mg | ORAL_TABLET | ORAL | 0 refills | Status: DC | PRN
Start: 2024-03-28 — End: 2024-09-20

## 2024-03-28 MED ORDER — TOPIRAMATE 50 MG PO TABS
50.0000 mg | ORAL_TABLET | Freq: Two times a day (BID) | ORAL | 2 refills | Status: DC
Start: 2024-03-28 — End: 2024-06-27

## 2024-03-28 NOTE — Progress Notes (Addendum)
 New Patient Office Visit  Subjective    Patient ID: Sierra Deleon, female    DOB: [birth year]  Age: 63 y.o. MRN: [id number]  CC:  Chief Complaint  Patient presents with   Establish Care    Pt here to establish care, also pt states "Migraines causing nausea and vomiting"    HPI Sierra Deleon presents to establish care .  Outpatient Encounter Medications as of 03/28/2024  Medication Sig   albuterol (VENTOLIN HFA) 108 (90 Base) MCG/ACT inhaler Inhale 2 puffs into the lungs every 4 (four) hours as needed.   anti-nausea (EMETROL) solution Take 10 mLs by mouth every 15 (fifteen) minutes as needed for nausea or vomiting.   atorvastatin  (LIPITOR) 20 MG tablet Take 1 tablet (20 mg total) by mouth daily.   cloNIDine (CATAPRES) 0.1 MG tablet Take 0.1 mg by mouth daily.   diphenhydrAMINE (BENADRYL) 25 mg capsule Take 25 mg by mouth every 6 (six) hours as needed.   DULoxetine (CYMBALTA) 30 MG capsule Take 30 mg by mouth 2 (two) times daily.   gabapentin  (NEURONTIN ) 300 MG capsule Take 1 capsule (300 mg total) by mouth 3 (three) times daily.   gabapentin  (NEURONTIN ) 600 MG tablet Take 400 mg by mouth 3 (three) times daily.   omeprazole  (PRILOSEC) 40 MG capsule Take 1 capsule (40 mg total) by mouth in the morning and at bedtime.   SUMAtriptan  (IMITREX ) 25 MG tablet Take 1 tablet (25 mg total) by mouth every 2 (two) hours as needed for migraine. May repeat in 2 hours if headache persists or recurs. No more than two tablets in 24 hour period.   topiramate  (TOPAMAX ) 50 MG tablet Take 1 tablet (50 mg total) by mouth 2 (two) times daily.   baclofen (LIORESAL) 10 MG tablet Take 10 mg by mouth 2 (two) times daily as needed.   [DISCONTINUED] Acetamin Chew Tab & Susp 160 & 160 MG &MG/5ML THPK Take by mouth. (Patient not taking: Reported on 03/28/2024)   [DISCONTINUED] cyclobenzaprine  (FLEXERIL ) 10 MG tablet Take 10 mg by mouth at bedtime.   No facility-administered encounter medications on file as of  03/28/2024.    Past Medical History:  Diagnosis Date   Anxiety    Arthritis    Asthma    COPD (chronic obstructive pulmonary disease) (HCC)    Depression    Sciatica     Past Surgical History:  Procedure Laterality Date   APPENDECTOMY     CHOLECYSTECTOMY     TONSILLECTOMY      Family History  Problem Relation Age of Onset   Charcot-Marie-Tooth disease Mother    Heart disease Father    Charcot-Marie-Tooth disease Father    Breast cancer Paternal Grandmother     Social History   Socioeconomic History   Marital status: Married    Spouse name: Not on file   Number of children: Not on file   Years of education: Not on file   Highest education level: Not on file  Occupational History   Not on file  Tobacco Use   Smoking status: Every Day    Current packs/day: 0.00    Types: Cigarettes    Last attempt to quit: 08/20/2021    Years since quitting: 2.6    Passive exposure: Current   Smokeless tobacco: Never  Vaping Use   Vaping status: Never Used  Substance and Sexual Activity   Alcohol  use: Not Currently   Drug use: Never   Sexual activity: Not Currently  Other  Topics Concern   Not on file  Social History Narrative   Not on file   Social Drivers of Health   Financial Resource Strain: Not on file  Food Insecurity: No Food Insecurity (03/28/2024)   Hunger Vital Sign    Worried About Running Out of Food in the Last Year: Never true    Ran Out of Food in the Last Year: Never true  Transportation Needs: No Transportation Needs (03/28/2024)   PRAPARE - Administrator, Civil Service (Medical): No    Lack of Transportation (Non-Medical): No  Physical Activity: Not on file  Stress: Not on file  Social Connections: Not on file  Intimate Partner Violence: Not At Risk (03/28/2024)   Humiliation, Afraid, Rape, and Kick questionnaire    Fear of Current or Ex-Partner: No    Emotionally Abused: No    Physically Abused: No    Sexually Abused: No    ROS       Objective    BP 128/82   Pulse (!) 118   Ht 5\' 7"  (1.702 m)   Wt 200 lb 6.4 oz (90.9 kg)   SpO2 90%   BMI 31.39 kg/m   Physical Exam Vitals and nursing note reviewed.  Constitutional:      Appearance: Normal appearance.  HENT:     Head: Normocephalic.     Right Ear: Tympanic membrane, ear canal and external ear normal.     Left Ear: Tympanic membrane, ear canal and external ear normal.     Nose: No congestion.     Mouth/Throat:     Mouth: Mucous membranes are moist.     Pharynx: Oropharynx is clear.  Eyes:     Extraocular Movements: Extraocular movements intact.     Pupils: Pupils are equal, round, and reactive to light.  Cardiovascular:     Rate and Rhythm: Normal rate and regular rhythm.  Pulmonary:     Effort: Pulmonary effort is normal.     Breath sounds: Normal breath sounds.  Musculoskeletal:     Cervical back: Normal range of motion and neck supple.  Neurological:     Mental Status: She is alert and oriented to person, place, and time.  Psychiatric:        Mood and Affect: Mood normal.        Thought Content: Thought content normal.         Assessment & Plan:   Problem List Items Addressed This Visit       Cardiovascular and Mediastinum   Chronic migraine without aura without status migrainosus, not intractable - Primary   Her neurologist had add clonidine at bedtime, which she states was ineffective.  Her migraines may be related to her issues with cervical spine, but we will try Topamax  and Imitrex  to see if she has any improvement in symptoms.  I discussed dosing of the Topamax  and imitrex .  I also advised her to take a Baclofen at bedtime, since that is when her HA are worse.  She has agreed to make these changes.  Recommend discussing these changes at upcoming neurology appt.       Relevant Medications   baclofen (LIORESAL) 10 MG tablet   topiramate  (TOPAMAX ) 50 MG tablet   SUMAtriptan  (IMITREX ) 25 MG tablet    No follow-ups on file.    Alison Irvine, FNP

## 2024-03-28 NOTE — Patient Instructions (Addendum)
 1. Chronic migraine without aura without status migrainosus, not intractable (Primary)  Add Topamax for daily preventative.   Start with one tablet a day, at bedtime for the first week, then increase to two times a day.   Imitrex add for migraine relief.  Use these for severe migraine relief.   Continue with OTC Excedrin and Aleve/ibuprofen as currently doing.    Recommend discussing further with neurologist if no improvement with above treatment plan.

## 2024-03-29 ENCOUNTER — Telehealth: Payer: Self-pay

## 2024-03-29 DIAGNOSIS — G43709 Chronic migraine without aura, not intractable, without status migrainosus: Secondary | ICD-10-CM | POA: Insufficient documentation

## 2024-03-29 NOTE — Assessment & Plan Note (Signed)
 Her neurologist had add clonidine at bedtime, which she states was ineffective.  Her migraines may be related to her issues with cervical spine, but we will try Topamax and Imitrex to see if she has any improvement in symptoms.  I discussed dosing of the Topamax and imitrex.  I also advised her to take a Baclofen at bedtime, since that is when her HA are worse.  She has agreed to make these changes.  Recommend discussing these changes at upcoming neurology appt.

## 2024-03-29 NOTE — Telephone Encounter (Signed)
 Copied from CRM [id number]. Topic: Clinical - Prescription Issue >> Mar 28, 2024  4:16 PM Fuller Mandril wrote: Reason for CRM: Patient called states she was seen today and was supposed to have 2 prescriptions called in but pharmacy says they have only received one. Have note received SUMAtriptan (IMITREX) 25 MG tablet. Let her know it was sent and confirmed but it may just take some time to go through all the way. She said they told her they have not received it. Thank You

## 2024-04-03 ENCOUNTER — Telehealth: Payer: Self-pay

## 2024-04-03 DIAGNOSIS — M961 Postlaminectomy syndrome, not elsewhere classified: Secondary | ICD-10-CM | POA: Diagnosis not present

## 2024-04-03 DIAGNOSIS — Z9689 Presence of other specified functional implants: Secondary | ICD-10-CM | POA: Diagnosis not present

## 2024-04-03 NOTE — Telephone Encounter (Signed)
 Copied from CRM [id number]. Topic: General - Call Back - No Documentation >> Apr 03, 2024 10:18 AM Bearl Botts E wrote: Reason for CRM: Pt returned missed call from this morning around 9:30. Says the VM has audio issues and she cannot understand what is being said. Please advise   Best contact: [phone]

## 2024-04-05 NOTE — Telephone Encounter (Signed)
 Left voicemail pt was advised

## 2024-06-26 ENCOUNTER — Other Ambulatory Visit: Payer: Self-pay

## 2024-06-26 DIAGNOSIS — G43709 Chronic migraine without aura, not intractable, without status migrainosus: Secondary | ICD-10-CM

## 2024-07-31 ENCOUNTER — Other Ambulatory Visit: Payer: Self-pay

## 2024-07-31 NOTE — Telephone Encounter (Signed)
 Copied from CRM [id number]. Topic: Clinical - Medication Refill >> Jul 31, 2024  1:26 PM Wess RAMAN wrote: Medication: omeprazole  (PRILOSEC) 40 MG capsule   Has the patient contacted their pharmacy? No (Agent: If no, request that the patient contact the pharmacy for the refill. If patient does not wish to contact the pharmacy document the reason why and proceed with request.) (Agent: If yes, when and what did the pharmacy advise?)  This is the patient's preferred pharmacy:   [id number] - [place],  - [address] [place] [address] Phone: [phone] Fax: [phone]  Is this the correct pharmacy for this prescription? Yes If no, delete pharmacy and type the correct one.   Has the prescription been filled recently? No  Is the patient out of the medication? No  Has the patient been seen for an appointment in the last year OR does the patient have an upcoming appointment? Yes  Can we respond through MyChart? Yes  Agent: Please be advised that Rx refills may take up to 3 business days. We ask that you follow-up with your pharmacy.

## 2024-08-01 MED ORDER — OMEPRAZOLE 40 MG PO CPDR: 40.0000 mg | DELAYED_RELEASE_CAPSULE | Freq: Two times a day (BID) | ORAL | 1 refills | Status: DC

## 2024-08-29 ENCOUNTER — Ambulatory Visit

## 2024-09-12 ENCOUNTER — Ambulatory Visit (INDEPENDENT_AMBULATORY_CARE_PROVIDER_SITE_OTHER)

## 2024-09-12 VITALS — Ht 67.0 in | Wt 200.0 lb

## 2024-09-12 DIAGNOSIS — Z0000 Encounter for general adult medical examination without abnormal findings: Secondary | ICD-10-CM

## 2024-09-12 DIAGNOSIS — Z114 Encounter for screening for human immunodeficiency virus [HIV]: Secondary | ICD-10-CM

## 2024-09-12 DIAGNOSIS — Z1159 Encounter for screening for other viral diseases: Secondary | ICD-10-CM

## 2024-09-12 DIAGNOSIS — Z1211 Encounter for screening for malignant neoplasm of colon: Secondary | ICD-10-CM

## 2024-09-12 DIAGNOSIS — Z124 Encounter for screening for malignant neoplasm of cervix: Secondary | ICD-10-CM

## 2024-09-12 DIAGNOSIS — Z1231 Encounter for screening mammogram for malignant neoplasm of breast: Secondary | ICD-10-CM

## 2024-09-12 NOTE — Patient Instructions (Addendum)
 Ms. Runions,  Thank you for taking the time for your Medicare Wellness Visit. I appreciate your continued commitment to your health goals. Please review the care plan we discussed, and feel free to reach out if I can assist you further.  Medicare recommends these wellness visits once per year to help you and your care team stay ahead of potential health issues. These visits are designed to focus on prevention, allowing your provider to concentrate on managing your acute and chronic conditions during your regular appointments.  Please note that Annual Wellness Visits do not include a physical exam. Some assessments may be limited, especially if the visit was conducted virtually. If needed, we may recommend a separate in-person follow-up with your provider.  Ongoing Care Seeing your primary care provider every 3 to 6 months helps us  monitor your health and provide consistent, personalized care.   Referrals If a referral was made during today's visit and you haven't received any updates within two weeks, please contact the referred provider directly to check on the status. A Cologuard was ordered for you today. It's a colon cancer screening test that will arrive at your home soon. Cologuard is an easy-to-use, noninvasive test. You collect a sample in your own home and send it back for testing.  Kathrynn is $0 cost to you because Cologuard is a benefit to you.  -Your Cologuard kit will arrive in a white box. -Follow the instructions inside.  No prep is required.  -Return the kit via UPS prepaid shipping.  You'll use the same box it arrived in.  -Your result should be available within two weeks.  -Want to view a how-to-use video and get help returning your Cologuard Kit? Go to [email or web address] or scan the QR code on this page.    Breast Cancer Screening/Mammogram @ [facility] Call [phone] No perfumes, lotions, or deodorants the day of your screening. You can schedule your mammogram  through mychart!  Lab Orders         Cologuard         Hepatitis C antibody         HIV Antibody (routine testing w rflx)    Have these labs completed at [facility].   Cervical Cancer Screening/Pap Smear [facility] Address: [address], [place], [place] [address] Phone: [phone]  Recommended Screenings:  Health Maintenance  Topic Date Due   Hepatitis C Screening  Never done   DTaP/Tdap/Td vaccine (1 - Tdap) Never done   Pneumococcal Vaccine for age over 60 (1 of 2 - PCV) Never done   Pap with HPV screening  Never done   Colon Cancer Screening  Never done   Zoster (Shingles) Vaccine (1 of 2) Never done   Breast Cancer Screening  10/06/2023   Flu Shot  Never done   COVID-19 Vaccine (1 - 2024-25 season) Never done   Medicare Annual Wellness Visit  09/12/2025   HIV Screening  Completed   Hepatitis B Vaccine  Aged Out   HPV Vaccine  Aged Out   Meningitis B Vaccine  Aged Out       09/12/2024   11:02 AM  Advanced Directives  Does Patient Have a Medical Advance Directive? No  Would patient like information on creating a medical advance directive? No - Patient declined   Advance Care Planning is important because it: Ensures you receive medical care that aligns with your values, goals, and preferences. Provides guidance to your family and loved ones, reducing  the emotional burden of decision-making during critical moments.  Vision: Annual vision screenings are recommended for early detection of glaucoma, cataracts, and diabetic retinopathy. These exams can also reveal signs of chronic conditions such as diabetes and high blood pressure.  Dental: Annual dental screenings help detect early signs of oral cancer, gum disease, and other conditions linked to overall health, including heart disease and diabetes.  Please see the attached documents for additional preventive care recommendations.

## 2024-09-12 NOTE — Progress Notes (Signed)
 Subjective:   Sierra Deleon is a 63 y.o. who presents for a Medicare Wellness preventive visit.  As a reminder, Annual Wellness Visits don't include a physical exam, and some assessments may be limited, especially if this visit is performed virtually. We may recommend an in-person follow-up visit with your provider if needed.  Visit Complete: Virtual I connected with  Sierra Deleon on 09/12/24 by a audio enabled telemedicine application and verified that I am speaking with the correct person using two identifiers.  Patient Location: Home  Provider Location: Home Office  I discussed the limitations of evaluation and management by telemedicine. The patient expressed understanding and agreed to proceed.  Vital Signs: Because this visit was a virtual/telehealth visit, some criteria may be missing or patient reported. Any vitals not documented were not able to be obtained and vitals that have been documented are patient reported.  VideoError- Librarian, academic were attempted between this provider and patient, however failed, due to patient having technical difficulties OR patient did not have access to video capability.  We continued and completed visit with audio only.   Persons Participating in Visit: Patient.  AWV Questionnaire: No: Patient Medicare AWV questionnaire was not completed prior to this visit.  Cardiac Risk Factors include: obesity (BMI >30kg/m2);smoking/ tobacco exposure;dyslipidemia;sedentary lifestyle     Objective:    Today's Vitals   09/12/24 1119  Weight: 200 lb (90.7 kg)  Height: 5' 7 (1.702 m)  PainSc: 0-No pain   Body mass index is 31.32 kg/m.     09/12/2024   11:02 AM 01/09/2021    1:10 PM  Advanced Directives  Does Patient Have a Medical Advance Directive? No No  Would patient like information on creating a medical advance directive? No - Patient declined No - Patient declined    Current Medications  (verified) Outpatient Encounter Medications as of 09/12/2024  Medication Sig   albuterol (VENTOLIN HFA) 108 (90 Base) MCG/ACT inhaler Inhale 2 puffs into the lungs every 4 (four) hours as needed.   anti-nausea (EMETROL) solution Take 10 mLs by mouth every 15 (fifteen) minutes as needed for nausea or vomiting.   atorvastatin  (LIPITOR) 20 MG tablet Take 1 tablet (20 mg total) by mouth daily.   baclofen (LIORESAL) 10 MG tablet Take 10 mg by mouth 2 (two) times daily as needed.   diphenhydrAMINE (BENADRYL) 25 mg capsule Take 25 mg by mouth every 6 (six) hours as needed.   DULoxetine (CYMBALTA) 30 MG capsule Take 30 mg by mouth 2 (two) times daily.   Fluticasone Furoate 100 MCG/ACT AEPB Inhale 2 puffs into the lungs in the morning and at bedtime.   gabapentin  (NEURONTIN ) 600 MG tablet Take 600 mg by mouth 3 (three) times daily.   omeprazole  (PRILOSEC) 40 MG capsule Take 1 capsule (40 mg total) by mouth in the morning and at bedtime.   SUMAtriptan  (IMITREX ) 25 MG tablet Take 1 tablet (25 mg total) by mouth every 2 (two) hours as needed for migraine. May repeat in 2 hours if headache persists or recurs. No more than two tablets in 24 hour period.   topiramate  (TOPAMAX ) 50 MG tablet TAKE 1 TABLET(50 MG) BY MOUTH TWICE DAILY   [DISCONTINUED] cloNIDine (CATAPRES) 0.1 MG tablet Take 0.1 mg by mouth daily. (Patient not taking: Reported on 09/12/2024)   [DISCONTINUED] gabapentin  (NEURONTIN ) 300 MG capsule Take 1 capsule (300 mg total) by mouth 3 (three) times daily.   [DISCONTINUED] gabapentin  (NEURONTIN ) 600 MG tablet Take 400  mg by mouth 3 (three) times daily.   No facility-administered encounter medications on file as of 09/12/2024.    Allergies (verified) Codeine   History: Past Medical History:  Diagnosis Date   Anxiety    Arthritis    Asthma    COPD (chronic obstructive pulmonary disease) (HCC)    Depression    Sciatica    Past Surgical History:  Procedure Laterality Date   APPENDECTOMY      CHOLECYSTECTOMY     TONSILLECTOMY     Family History  Problem Relation Age of Onset   Charcot-Marie-Tooth disease Mother    Heart disease Father    Charcot-Marie-Tooth disease Father    Breast cancer Paternal Grandmother    Social History   Socioeconomic History   Marital status: Married    Spouse name: Not on file   Number of children: Not on file   Years of education: Not on file   Highest education level: Not on file  Occupational History   Not on file  Tobacco Use   Smoking status: Every Day    Current packs/day: 0.00    Types: Cigarettes    Last attempt to quit: 08/20/2021    Years since quitting: 3.0    Passive exposure: Current   Smokeless tobacco: Never  Vaping Use   Vaping status: Never Used  Substance and Sexual Activity   Alcohol  use: Not Currently   Drug use: Never   Sexual activity: Not Currently  Other Topics Concern   Not on file  Social History Narrative   Not on file   Social Drivers of Health   Financial Resource Strain: Low Risk  (09/12/2024)   Overall Financial Resource Strain (CARDIA)    Difficulty of Paying Living Expenses: Not hard at all  Food Insecurity: No Food Insecurity (09/12/2024)   Hunger Vital Sign    Worried About Running Out of Food in the Last Year: Never true    Ran Out of Food in the Last Year: Never true  Transportation Needs: No Transportation Needs (09/12/2024)   PRAPARE - Administrator, Civil Service (Medical): No    Lack of Transportation (Non-Medical): No  Physical Activity: Inactive (09/12/2024)   Exercise Vital Sign    Days of Exercise per Week: 0 days    Minutes of Exercise per Session: 0 min  Stress: No Stress Concern Present (09/12/2024)   [employer] of Occupational Health - Occupational Stress Questionnaire    Feeling of Stress: Not at all  Social Connections: Patient Declined (09/12/2024)   Social Connection and Isolation Panel    Frequency of Communication with Friends and Family: Patient  declined    Frequency of Social Gatherings with Friends and Family: Patient declined    Attends Religious Services: Patient declined    Database administrator or Organizations: Patient declined    Attends Engineer, structural: Patient declined    Marital Status: Patient declined    Tobacco Counseling Ready to quit: No Counseling given: Yes    Clinical Intake:  Pre-visit preparation completed: Yes  Pain : No/denies pain Pain Score: 0-No pain     BMI - recorded: 31.32 Nutritional Status: BMI > 30  Obese Nutritional Risks: None Diabetes: No  Lab Results  Component Value Date   HGBA1C 5.4 09/14/2021     How often do you need to have someone help you when you read instructions, pamphlets, or other written materials from your doctor or pharmacy?: 1 - Never  Interpreter  Needed?: No  Information entered by :: Angeles Zehner W CMA (AAMA)   Activities of Daily Living     09/12/2024   11:28 AM  In your present state of health, do you have any difficulty performing the following activities:  Hearing? 0  Vision? 1  Difficulty concentrating or making decisions? 0  Walking or climbing stairs? 1  Dressing or bathing? 0  Doing errands, shopping? 1  Comment no longer drives  Preparing Food and eating ? N  Using the Toilet? N  In the past six months, have you accidently leaked urine? N  Do you have problems with loss of bowel control? N  Managing your Medications? N  Managing your Finances? N  Housekeeping or managing your Housekeeping? N    Patient Care Team: Bevely Doffing, FNP as PCP - General (Family Medicine) Darlis Deatrice RAMAN, MD as Consulting Physician (Pain Medicine) Carlette Benita Area, MD as Referring Physician (Internal Medicine) Leris, Dennis, PA-C (Physician Assistant) Margaret Eduard SAUNDERS, MD as Consulting Physician (Neurology) Leafy Area DEL, NP as Nurse Practitioner (Gerontology) Ablott, Camellia, OD (Optometry)  I have updated your Care Teams any  recent Medical Services you may have received from other providers in the past year.     Assessment:   This is a routine wellness examination for Sierra Deleon.  Hearing/Vision screen Hearing Screening - Comments:: Patient denies any hearing difficulties.   Vision Screening - Comments:: Patient has difficulty seeing. Will be seeing Dr. Medford Gaudy December 31, 2024   Goals Addressed               This Visit's Progress     Lose weight (pt-stated)          Depression Screen     09/12/2024    2:09 PM 03/28/2024    2:41 PM  PHQ 2/9 Scores  PHQ - 2 Score 0 2  PHQ- 9 Score 0 14     Fall Risk     09/12/2024   11:27 AM 03/28/2024    2:41 PM  Fall Risk   Falls in the past year? 1 1  Number falls in past yr: 1 1  Injury with Fall? 0 1  Risk for fall due to : History of fall(s);Impaired balance/gait;Impaired mobility;Impaired vision;Other (Comment) History of fall(s)  Risk for fall due to: Comment patient has a spinal stimulator and peripheral neuropathy which has caused her to lose feeling in the bottom of her feet   Follow up Falls evaluation completed;Education provided;Falls prevention discussed Falls evaluation completed    MEDICARE RISK AT HOME:  Medicare Risk at Home Any stairs in or around the home?: Yes If so, are there any without handrails?: No Home free of loose throw rugs in walkways, pet beds, electrical cords, etc?: Yes Adequate lighting in your home to reduce risk of falls?: Yes Use of a cane, walker or w/c?: Yes Grab bars in the bathroom?: Yes Shower chair or bench in shower?: Yes Elevated toilet seat or a handicapped toilet?: Yes  TIMED UP AND GO:  Was the test performed?  No  Cognitive Function: 6CIT completed        09/12/2024   11:29 AM  6CIT Screen  What Year? 0 points  What month? 0 points  What time? 0 points  Count back from 20 0 points  Months in reverse 0 points  Repeat phrase 0 points  Total Score 0 points    Immunizations  There is  no immunization history on file for this patient.  Screening Tests Health Maintenance  Topic Date Due   Hepatitis C Screening  Never done   DTaP/Tdap/Td (1 - Tdap) Never done   Pneumococcal Vaccine: 50+ Years (1 of 2 - PCV) Never done   Cervical Cancer Screening (HPV/Pap Cotest)  Never done   Colonoscopy  Never done   Zoster Vaccines- Shingrix (1 of 2) Never done   Mammogram  10/06/2023   Influenza Vaccine  Never done   COVID-19 Vaccine (1 - 2024-25 season) Never done   Medicare Annual Wellness (AWV)  09/12/2025   HIV Screening  Completed   Hepatitis B Vaccines 19-59 Average Risk  Aged Out   HPV VACCINES  Aged Out   Meningococcal B Vaccine  Aged Out    Health Maintenance Health Maintenance Due  Topic Date Due   Hepatitis C Screening  Never done   DTaP/Tdap/Td (1 - Tdap) Never done   Pneumococcal Vaccine: 50+ Years (1 of 2 - PCV) Never done   Cervical Cancer Screening (HPV/Pap Cotest)  Never done   Colonoscopy  Never done   Zoster Vaccines- Shingrix (1 of 2) Never done   Mammogram  10/06/2023   Influenza Vaccine  Never done   COVID-19 Vaccine (1 - 2024-25 season) Never done   Health Maintenance Items Addressed: Mammogram ordered, Cologuard Ordered, Labs Ordered:  , referral placed for cervical cancer screening Lab Orders         Cologuard         Hepatitis C antibody         HIV Antibody (routine testing w rflx)     Additional Screening:  Vision Screening: Recommended annual ophthalmology exams for early detection of glaucoma and other disorders of the eye. Would you like a referral to an eye doctor? No    Dental Screening: Recommended annual dental exams for proper oral hygiene  Community Resource Referral / Chronic Care Management: CRR required this visit?  No   CCM required this visit?  No   Plan:    I have personally reviewed and noted the following in the patient's chart:   Medical and social history Use of alcohol , tobacco or illicit drugs  Current  medications and supplements including opioid prescriptions. Patient is not currently taking opioid prescriptions. Functional ability and status Nutritional status Physical activity Advanced directives List of other physicians Hospitalizations, surgeries, and ER visits in previous 12 months Vitals Screenings to include cognitive, depression, and falls Referrals and appointments  In addition, I have reviewed and discussed with patient certain preventive protocols, quality metrics, and best practice recommendations. A written personalized care plan for preventive services as well as general preventive health recommendations were provided to patient.   Tinika Bucknam, CMA   09/12/2024   After Visit Summary: (MyChart) Due to this being a telephonic visit, the after visit summary with patients personalized plan was offered to patient via MyChart   Notes: Nothing significant to report at this time.

## 2024-09-19 ENCOUNTER — Other Ambulatory Visit: Payer: Self-pay

## 2024-09-19 DIAGNOSIS — G43709 Chronic migraine without aura, not intractable, without status migrainosus: Secondary | ICD-10-CM

## 2024-10-04 ENCOUNTER — Other Ambulatory Visit: Payer: Self-pay

## 2024-10-04 DIAGNOSIS — G43709 Chronic migraine without aura, not intractable, without status migrainosus: Secondary | ICD-10-CM

## 2024-10-04 NOTE — Progress Notes (Deleted)
 No chief complaint on file.   HISTORY OF PRESENT ILLNESS:  10/04/24 ALL:  Sierra Deleon is a 63 y.o. female here today for follow up for numbness. She was seen in consult with [name] 02/2023. MRI unremarkable. Neuropathy labs normal. She was advised to continue gabapentin  and duloxetine and referred to pain management.   Since,    HISTORY (copied from [name] previous note)  63 year old female here for evaluation of neuropathy.  Patient has had numbness, pain, tingling and feet, legs, hands since at least 2014.  This is progressed over time.  Eventually she saw neurologist at [facility], had extensive testing, was diagnosed with small fiber neuropathy based on skin biopsy.  No specific cause was found.  She also had some cervical and lumbar spinal stenosis, referred to neurosurgery, managed conservatively.  She went to another neurologist for pain management and was treated with gabapentin  and duloxetine.   Patient also mentions some chronic issues related to memory loss, anxiety, insomnia, hallucinations, visual distortions.   REVIEW OF SYSTEMS: Out of a complete 14 system review of symptoms, the patient complains only of the following symptoms, and all other reviewed systems are negative.   ALLERGIES: Allergies  Allergen Reactions   Codeine Shortness Of Breath     HOME MEDICATIONS: Outpatient Medications Prior to Visit  Medication Sig Dispense Refill   albuterol (VENTOLIN HFA) 108 (90 Base) MCG/ACT inhaler Inhale 2 puffs into the lungs every 4 (four) hours as needed.     anti-nausea (EMETROL) solution Take 10 mLs by mouth every 15 (fifteen) minutes as needed for nausea or vomiting.     atorvastatin  (LIPITOR) 20 MG tablet Take 1 tablet (20 mg total) by mouth daily. 90 tablet 1   baclofen (LIORESAL) 10 MG tablet Take 10 mg by mouth 2 (two) times daily as needed.     diphenhydrAMINE (BENADRYL) 25 mg capsule Take 25 mg by mouth every 6 (six) hours as needed.      DULoxetine (CYMBALTA) 30 MG capsule Take 30 mg by mouth 2 (two) times daily.     Fluticasone Furoate 100 MCG/ACT AEPB Inhale 2 puffs into the lungs in the morning and at bedtime.     gabapentin  (NEURONTIN ) 600 MG tablet Take 600 mg by mouth 3 (three) times daily.     omeprazole  (PRILOSEC) 40 MG capsule Take 1 capsule (40 mg total) by mouth in the morning and at bedtime. 180 capsule 1   SUMAtriptan  (IMITREX ) 25 MG tablet TAKE 1 TABLET BY MOUTH AS NEEDED FOR MIGRAINE. MAY REPEAT IN 2 HOURS IF HEADACHE PERSISTS OR RECURS NO MORE THAN 2 TABS IN 24 HOURS 10 tablet 0   topiramate  (TOPAMAX ) 50 MG tablet TAKE 1 TABLET(50 MG) BY MOUTH TWICE DAILY 60 tablet 2   No facility-administered medications prior to visit.     PAST MEDICAL HISTORY: Past Medical History:  Diagnosis Date   Anxiety    Arthritis    Asthma    COPD (chronic obstructive pulmonary disease) (HCC)    Depression    Sciatica      PAST SURGICAL HISTORY: Past Surgical History:  Procedure Laterality Date   APPENDECTOMY     CHOLECYSTECTOMY     TONSILLECTOMY       FAMILY HISTORY: Family History  Problem Relation Age of Onset   Charcot-Marie-Tooth disease Mother    Heart disease Father    Charcot-Marie-Tooth disease Father    Breast cancer Paternal Grandmother      SOCIAL HISTORY: Social  History   Socioeconomic History   Marital status: Married    Spouse name: Not on file   Number of children: Not on file   Years of education: Not on file   Highest education level: Not on file  Occupational History   Not on file  Tobacco Use   Smoking status: Every Day    Current packs/day: 0.00    Types: Cigarettes    Last attempt to quit: 08/20/2021    Years since quitting: 3.1    Passive exposure: Current   Smokeless tobacco: Never  Vaping Use   Vaping status: Never Used  Substance and Sexual Activity   Alcohol  use: Not Currently   Drug use: Never   Sexual activity: Not Currently  Other Topics Concern   Not on file   Social History Narrative   Not on file   Social Drivers of Health   Financial Resource Strain: Low Risk  (09/12/2024)   Overall Financial Resource Strain (CARDIA)    Difficulty of Paying Living Expenses: Not hard at all  Food Insecurity: No Food Insecurity (09/12/2024)   Hunger Vital Sign    Worried About Running Out of Food in the Last Year: Never true    Ran Out of Food in the Last Year: Never true  Transportation Needs: No Transportation Needs (09/12/2024)   PRAPARE - Administrator, Civil Service (Medical): No    Lack of Transportation (Non-Medical): No  Physical Activity: Inactive (09/12/2024)   Exercise Vital Sign    Days of Exercise per Week: 0 days    Minutes of Exercise per Session: 0 min  Stress: No Stress Concern Present (09/12/2024)   [employer] of Occupational Health - Occupational Stress Questionnaire    Feeling of Stress: Not at all  Social Connections: Patient Declined (09/12/2024)   Social Connection and Isolation Panel    Frequency of Communication with Friends and Family: Patient declined    Frequency of Social Gatherings with Friends and Family: Patient declined    Attends Religious Services: Patient declined    Database administrator or Organizations: Patient declined    Attends Banker Meetings: Patient declined    Marital Status: Patient declined  Intimate Partner Violence: Not At Risk (09/12/2024)   Humiliation, Afraid, Rape, and Kick questionnaire    Fear of Current or Ex-Partner: No    Emotionally Abused: No    Physically Abused: No    Sexually Abused: No     PHYSICAL EXAM  There were no vitals filed for this visit. There is no height or weight on file to calculate BMI.  Generalized: Well developed, in no acute distress  Cardiology: normal rate and rhythm, no murmur auscultated  Respiratory: clear to auscultation bilaterally    Neurological examination  Mentation: Alert oriented to time, place, history taking.  Follows all commands speech and language fluent Cranial nerve II-XII: Pupils were equal round reactive to light. Extraocular movements were full, visual field were full on confrontational test. Facial sensation and strength were normal. Uvula tongue midline. Head turning and shoulder shrug  were normal and symmetric. Motor: The motor testing reveals 5 over 5 strength of all 4 extremities. Good symmetric motor tone is noted throughout.  Sensory: Sensory testing is intact to soft touch on all 4 extremities. No evidence of extinction is noted.  Coordination: Cerebellar testing reveals good finger-nose-finger and heel-to-shin bilaterally.  Gait and station: Gait is normal. Tandem gait is normal. Romberg is negative. No drift is seen.  Reflexes: Deep tendon reflexes are symmetric and normal bilaterally.    DIAGNOSTIC DATA (LABS, IMAGING, TESTING) - I reviewed patient records, labs, notes, testing and imaging myself where available.  Lab Results  Component Value Date   WBC 19.4 (H) 01/09/2021   HGB 14.7 01/09/2021   HCT 44.4 01/09/2021   MCV 89.2 01/09/2021   PLT 367 01/09/2021      Component Value Date/Time   NA 138 09/14/2021 0849   K 4.8 09/14/2021 0849   CL 104 09/14/2021 0849   CO2 25 09/14/2021 0849   GLUCOSE 107 (H) 09/14/2021 0849   BUN 13 09/14/2021 0849   CREATININE 0.87 09/14/2021 0849   CALCIUM  9.2 09/14/2021 0849   PROT 7.3 01/11/2022 0909   ALBUMIN 4.2 01/11/2022 0909   AST 14 (L) 01/11/2022 0909   ALT 21 01/11/2022 0909   ALKPHOS 79 01/11/2022 0909   BILITOT 0.3 01/11/2022 0909   GFRNONAA >60 09/14/2021 0849   Lab Results  Component Value Date   CHOL 178 01/11/2022   HDL 44 01/11/2022   LDLCALC 112 (H) 01/11/2022   TRIG 109 01/11/2022   CHOLHDL 4.0 01/11/2022   Lab Results  Component Value Date   HGBA1C 5.4 09/14/2021   No results found for: VITAMINB12 No results found for: TSH      No data to display               No data to display            ASSESSMENT AND PLAN  63 y.o. year old female  has a past medical history of Anxiety, Arthritis, Asthma, COPD (chronic obstructive pulmonary disease) (HCC), Depression, and Sciatica. here with    No diagnosis found.  Sierra Deleon ***.  Healthy lifestyle habits encouraged. *** will follow up with PCP as directed. *** will return to see me in ***, sooner if needed. *** verbalizes understanding and agreement with this plan.   No orders of the defined types were placed in this encounter.    No orders of the defined types were placed in this encounter.    Greig Forbes, MSN, FNP-C 10/04/2024, 11:22 AM  [facility] [address], [address] [place], [place] [address] [phone]

## 2024-10-04 NOTE — Patient Instructions (Incomplete)

## 2024-10-08 ENCOUNTER — Ambulatory Visit: Admitting: Family Medicine

## 2024-10-08 ENCOUNTER — Encounter: Payer: Self-pay | Admitting: Family Medicine

## 2024-10-10 DIAGNOSIS — Z1211 Encounter for screening for malignant neoplasm of colon: Secondary | ICD-10-CM | POA: Diagnosis not present

## 2024-10-17 LAB — COLOGUARD: COLOGUARD: NEGATIVE

## 2024-10-28 ENCOUNTER — Ambulatory Visit: Payer: Self-pay

## 2024-12-21 ENCOUNTER — Ambulatory Visit

## 2024-12-21 VITALS — BP 143/82 | HR 115 | Ht 67.0 in | Wt 201.1 lb

## 2024-12-21 DIAGNOSIS — G43709 Chronic migraine without aura, not intractable, without status migrainosus: Secondary | ICD-10-CM | POA: Diagnosis not present

## 2024-12-21 DIAGNOSIS — M961 Postlaminectomy syndrome, not elsewhere classified: Secondary | ICD-10-CM

## 2024-12-21 MED ORDER — SUMATRIPTAN SUCCINATE 25 MG PO TABS: 25.0000 mg | ORAL_TABLET | Freq: Once | ORAL | 3 refills | Status: AC

## 2024-12-21 MED ORDER — BACLOFEN 10 MG PO TABS: 10.0000 mg | ORAL_TABLET | Freq: Two times a day (BID) | ORAL | 3 refills | Status: AC | PRN

## 2024-12-21 NOTE — Progress Notes (Signed)
 "  Established Patient Office Visit  Subjective   Patient ID: Unnamed K Drum, female    DOB: 1961/04/12  Age: 64 y.o. MRN: [id number]  Chief Complaint  Patient presents with   Medical Management of Chronic Issues    Pt here for a follow up, pt has concerns left eye she can't see out of it can't see her neurologist unitl march 9th and needs a refill on her Baclofen     HPI Discussed the use of AI scribe software for clinical note transcription with the patient, who gave verbal consent to proceed.  History of Present Illness    Sierra Deleon is a 64 year old female who presents for a follow-up visit and medication refills.  Muscle spasticity - Requires baclofen  twice daily as needed for muscle spasticity. - Unable to see neurologist until March. - Needs baclofen  refilled to manage symptoms until next neurology appointment.  Visual disturbance - Experiencing vision issues. - Suspects vision problems may be related to nerve damage. - Eye doctor appointment scheduled for January 20th for further evaluation.  Insomnia - Not sleeping well.  Migraine management - Issue with Imitrex  prescription; pharmacy provided only a partial refill. - Seeking completion of Imitrex  refill for migraine management.     Patient Active Problem List   Diagnosis Date Noted   Chronic migraine without aura without status migrainosus, not intractable 03/29/2024   Failed back syndrome 03/28/2024   H/O Spinal surgery 03/28/2024   Lumbar radiculopathy 03/28/2024   Polyneuropathy 03/28/2024   Spinal stenosis in cervical region 03/28/2024    ROS    Objective:     BP (!) 143/82   Pulse (!) 115   Ht 5' 7 (1.702 m)   Wt 201 lb 1.9 oz (91.2 kg)   SpO2 91%   BMI 31.50 kg/m  BP Readings from Last 3 Encounters:  12/21/24 (!) 143/82  03/28/24 128/82  03/08/23 112/76   Wt Readings from Last 3 Encounters:  12/21/24 201 lb 1.9 oz (91.2 kg)  09/12/24 200 lb (90.7 kg)  03/28/24 200 lb 6.4 oz  (90.9 kg)     Physical Exam Vitals and nursing note reviewed.  Constitutional:      Appearance: Normal appearance.  HENT:     Head: Normocephalic.  Eyes:     Extraocular Movements: Extraocular movements intact.     Conjunctiva/sclera: Conjunctivae normal.     Pupils: Pupils are equal, round, and reactive to light.  Cardiovascular:     Rate and Rhythm: Normal rate and regular rhythm.  Pulmonary:     Effort: Pulmonary effort is normal.     Breath sounds: Normal breath sounds.  Musculoskeletal:     Cervical back: Normal range of motion and neck supple.  Neurological:     Mental Status: She is alert and oriented to person, place, and time.  Psychiatric:        Mood and Affect: Mood normal.        Thought Content: Thought content normal.      No results found for any visits on 12/21/24.  Last CBC Lab Results  Component Value Date   WBC 19.4 (H) 01/09/2021   HGB 14.7 01/09/2021   HCT 44.4 01/09/2021   MCV 89.2 01/09/2021   MCH 29.5 01/09/2021   RDW 13.4 01/09/2021   PLT 367 01/09/2021   Last metabolic panel Lab Results  Component Value Date   GLUCOSE 107 (H) 09/14/2021   NA 138 09/14/2021   K 4.8 09/14/2021  CL 104 09/14/2021   CO2 25 09/14/2021   BUN 13 09/14/2021   CREATININE 0.87 09/14/2021   GFRNONAA >60 09/14/2021   CALCIUM  9.2 09/14/2021   PROT 7.3 01/11/2022   ALBUMIN 4.2 01/11/2022   BILITOT 0.3 01/11/2022   ALKPHOS 79 01/11/2022   AST 14 (L) 01/11/2022   ALT 21 01/11/2022   ANIONGAP 9 09/14/2021   Last lipids Lab Results  Component Value Date   CHOL 178 01/11/2022   HDL 44 01/11/2022   LDLCALC 112 (H) 01/11/2022   TRIG 109 01/11/2022   CHOLHDL 4.0 01/11/2022   Last hemoglobin A1c Lab Results  Component Value Date   HGBA1C 5.4 09/14/2021   Last thyroid functions No results found for: TSH, T3TOTAL, T4TOTAL, FREET4, THYROIDAB Last vitamin D No results found for: 25OHVITD2, 25OHVITD3, VD25OH Last vitamin B12 and  Folate No results found for: VITAMINB12, FOLATE    The 10-year ASCVD risk score (Arnett DK, et al., 2019) is: 10.3%    Assessment & Plan:   Problem List Items Addressed This Visit       Cardiovascular and Mediastinum   Chronic migraine without aura without status migrainosus, not intractable   Chronic migraines managed with Imitrex . Previous refill incomplete. - Refilled Imitrex  prescription.      Relevant Medications   baclofen  (LIORESAL ) 10 MG tablet     Other   Failed back syndrome - Primary   Chronic condition managed with baclofen  as needed. - Refilled baclofen  prescription and sent to [facility].      Relevant Medications   baclofen  (LIORESAL ) 10 MG tablet    Return in about 6 months (around 06/20/2025) for chronic follow-up with PCP.    Leita Longs, FNP  "

## 2024-12-23 ENCOUNTER — Other Ambulatory Visit: Payer: Self-pay

## 2024-12-23 DIAGNOSIS — G43709 Chronic migraine without aura, not intractable, without status migrainosus: Secondary | ICD-10-CM

## 2024-12-23 NOTE — Assessment & Plan Note (Signed)
 Chronic migraines managed with Imitrex . Previous refill incomplete. - Refilled Imitrex  prescription.

## 2024-12-23 NOTE — Assessment & Plan Note (Signed)
 Chronic condition managed with baclofen  as needed. - Refilled baclofen  prescription and sent to [facility].

## 2025-01-04 ENCOUNTER — Other Ambulatory Visit: Payer: Self-pay

## 2025-01-07 ENCOUNTER — Other Ambulatory Visit: Payer: Self-pay

## 2025-01-07 ENCOUNTER — Telehealth: Payer: Self-pay

## 2025-01-07 DIAGNOSIS — M5416 Radiculopathy, lumbar region: Secondary | ICD-10-CM

## 2025-01-07 DIAGNOSIS — G629 Polyneuropathy, unspecified: Secondary | ICD-10-CM

## 2025-01-07 MED ORDER — DULOXETINE HCL 30 MG PO CPEP: 30.0000 mg | ORAL_CAPSULE | Freq: Two times a day (BID) | ORAL | 2 refills | Status: AC

## 2025-01-07 MED ORDER — GABAPENTIN 600 MG PO TABS: 600.0000 mg | ORAL_TABLET | Freq: Three times a day (TID) | ORAL | 2 refills | Status: AC

## 2025-01-07 NOTE — Telephone Encounter (Signed)
 Medications refilled

## 2025-01-07 NOTE — Telephone Encounter (Signed)
 Copied from CRM #[id number]. Topic: Clinical - Medication Refill >> Jan 07, 2025  2:48 PM Sierra Deleon wrote: Medication: gabapentin  (NEURONTIN ) 600 MG tablet DULoxetine  (CYMBALTA ) 30 MG capsule   Has the patient contacted their pharmacy? Yes (Agent: If no, request that the patient contact the pharmacy for the refill. If patient does not wish to contact the pharmacy document the reason why and proceed with request.) (Agent: If yes, when and what did the pharmacy advise?)  This is the patient's preferred pharmacy:  [facility] [address] [place] - [address] [place] [address] Phone: [phone] Fax: [phone]  [facility] [place], [place] - [address] [place] [address] [place] [address] Phone: [phone] Fax: [phone]  [facility] [phone] - [place] - [address] AT [facility] [address] [place] [address] Phone: [phone] Fax: [phone]  Is this the correct pharmacy for this prescription? Yes If no, delete pharmacy and type the correct one.   Has the prescription been filled recently? No  Is the patient out of the medication? Yes  Has the patient been seen for an appointment in the last year OR does the patient have an upcoming appointment? Yes  Can we respond through MyChart? Yes  Agent: Please be advised that Rx refills may take up to 3 business days. We ask that you follow-up with your pharmacy.

## 2025-01-08 NOTE — Telephone Encounter (Signed)
 Patient advised

## 2025-02-25 ENCOUNTER — Ambulatory Visit: Admitting: Family Medicine

## 2025-06-24 ENCOUNTER — Ambulatory Visit: Payer: Self-pay

## 2025-09-09 ENCOUNTER — Ambulatory Visit: Payer: Self-pay

## 2025-09-17 ENCOUNTER — Ambulatory Visit
